# Patient Record
Sex: Female | Born: 1965 | Race: White | Hispanic: No | Marital: Married | Smoking: Former smoker
Health system: Southern US, Community
[De-identification: ages and names within clinical notes are randomized; demographics above are authoritative.]

## PROBLEM LIST (undated history)

## (undated) ENCOUNTER — Ambulatory Visit: Payer: Self-pay

## (undated) DIAGNOSIS — F329 Major depressive disorder, single episode, unspecified: Secondary | ICD-10-CM

## (undated) DIAGNOSIS — E785 Hyperlipidemia, unspecified: Secondary | ICD-10-CM

## (undated) DIAGNOSIS — F32A Depression, unspecified: Secondary | ICD-10-CM

## (undated) DIAGNOSIS — E781 Pure hyperglyceridemia: Secondary | ICD-10-CM

## (undated) DIAGNOSIS — I10 Essential (primary) hypertension: Secondary | ICD-10-CM

## (undated) DIAGNOSIS — E669 Obesity, unspecified: Secondary | ICD-10-CM

## (undated) HISTORY — DX: Pure hyperglyceridemia: E78.1

## (undated) HISTORY — PX: ABDOMINAL HYSTERECTOMY: SHX81

## (undated) HISTORY — PX: TUBAL LIGATION: SHX77

## (undated) HISTORY — DX: Hyperlipidemia, unspecified: E78.5

---

## 1998-04-26 ENCOUNTER — Other Ambulatory Visit: Admission: RE | Admit: 1998-04-26 | Discharge: 1998-04-26 | Payer: Self-pay | Admitting: Obstetrics and Gynecology

## 1998-08-06 ENCOUNTER — Ambulatory Visit (HOSPITAL_COMMUNITY): Admission: RE | Admit: 1998-08-06 | Discharge: 1998-08-06 | Payer: Self-pay | Admitting: Obstetrics and Gynecology

## 1999-08-07 ENCOUNTER — Other Ambulatory Visit: Admission: RE | Admit: 1999-08-07 | Discharge: 1999-08-07 | Payer: Self-pay | Admitting: Obstetrics and Gynecology

## 2000-07-20 ENCOUNTER — Other Ambulatory Visit: Admission: RE | Admit: 2000-07-20 | Discharge: 2000-07-20 | Payer: Self-pay | Admitting: Obstetrics and Gynecology

## 2000-08-19 ENCOUNTER — Encounter: Payer: Self-pay | Admitting: Urology

## 2000-08-19 ENCOUNTER — Encounter: Admission: RE | Admit: 2000-08-19 | Discharge: 2000-08-19 | Payer: Self-pay | Admitting: Urology

## 2000-08-24 ENCOUNTER — Encounter: Admission: RE | Admit: 2000-08-24 | Discharge: 2000-08-24 | Payer: Self-pay | Admitting: Urology

## 2000-08-24 ENCOUNTER — Encounter: Payer: Self-pay | Admitting: Urology

## 2004-12-29 ENCOUNTER — Other Ambulatory Visit: Admission: RE | Admit: 2004-12-29 | Discharge: 2004-12-29 | Payer: Self-pay | Admitting: *Deleted

## 2009-03-30 ENCOUNTER — Emergency Department (HOSPITAL_COMMUNITY): Admission: EM | Admit: 2009-03-30 | Discharge: 2009-03-30 | Payer: Self-pay | Admitting: Emergency Medicine

## 2011-03-18 LAB — CBC
HCT: 41.3 % (ref 36.0–46.0)
Hemoglobin: 14 g/dL (ref 12.0–15.0)
MCHC: 34 g/dL (ref 30.0–36.0)
MCV: 91.3 fL (ref 78.0–100.0)
Platelets: 345 10*3/uL (ref 150–400)
RDW: 12.6 % (ref 11.5–15.5)

## 2011-03-18 LAB — DIFFERENTIAL
Basophils Absolute: 0 10*3/uL (ref 0.0–0.1)
Eosinophils Absolute: 0 10*3/uL (ref 0.0–0.7)
Eosinophils Relative: 0 % (ref 0–5)
Monocytes Absolute: 0.8 10*3/uL (ref 0.1–1.0)

## 2011-03-18 LAB — URINALYSIS, ROUTINE W REFLEX MICROSCOPIC
Glucose, UA: NEGATIVE mg/dL
Ketones, ur: >80 mg/dL — AB
Leukocytes, UA: NEGATIVE
Nitrite: NEGATIVE
Protein, ur: >300 mg/dL — AB
Specific Gravity, Urine: 1.029 (ref 1.005–1.030)
Urobilinogen, UA: 1 mg/dL (ref 0.0–1.0)
pH: 6 (ref 5.0–8.0)

## 2011-03-18 LAB — BASIC METABOLIC PANEL
BUN: 7 mg/dL (ref 6–23)
CO2: 23 mEq/L (ref 19–32)
Calcium: 8.7 mg/dL (ref 8.4–10.5)
Chloride: 104 mEq/L (ref 96–112)
Creatinine, Ser: 0.58 mg/dL (ref 0.4–1.2)
GFR calc Af Amer: >60 mL/min (ref >60)
GFR calc non Af Amer: >60 mL/min (ref >60)
Glucose, Bld: 147 mg/dL — ABNORMAL HIGH (ref 70–99)
Potassium: 3.6 mEq/L (ref 3.5–5.1)
Sodium: 135 mEq/L (ref 135–145)

## 2011-03-18 LAB — URINE MICROSCOPIC-ADD ON

## 2011-06-17 ENCOUNTER — Ambulatory Visit
Admission: RE | Admit: 2011-06-17 | Discharge: 2011-06-17 | Disposition: A | Discharge status: Home / Self Care | Payer: BC Managed Care – PPO | Source: Ambulatory Visit | Attending: Physician Assistant | Admitting: Physician Assistant

## 2011-06-17 ENCOUNTER — Other Ambulatory Visit: Payer: Self-pay | Admitting: Physician Assistant

## 2011-06-17 DIAGNOSIS — M79609 Pain in unspecified limb: Secondary | ICD-10-CM

## 2014-03-06 ENCOUNTER — Ambulatory Visit
Admission: RE | Admit: 2014-03-06 | Discharge: 2014-03-06 | Disposition: A | Discharge status: Home / Self Care | Payer: BC Managed Care – PPO | Source: Ambulatory Visit | Attending: Nurse Practitioner | Admitting: Nurse Practitioner

## 2014-03-06 ENCOUNTER — Other Ambulatory Visit: Payer: Self-pay | Admitting: Nurse Practitioner

## 2014-03-06 DIAGNOSIS — R059 Cough, unspecified: Secondary | ICD-10-CM

## 2014-03-06 DIAGNOSIS — R05 Cough: Secondary | ICD-10-CM

## 2015-06-16 IMAGING — CR DG CHEST 2V
3 series · 3 of 3 positions shown · non-contrast
Comparison: Chest x-ray of 03/30/2009

CLINICAL DATA: Cough for 2 months

EXAM:
CHEST  2 VIEW

[view not recorded (1 of 3)]
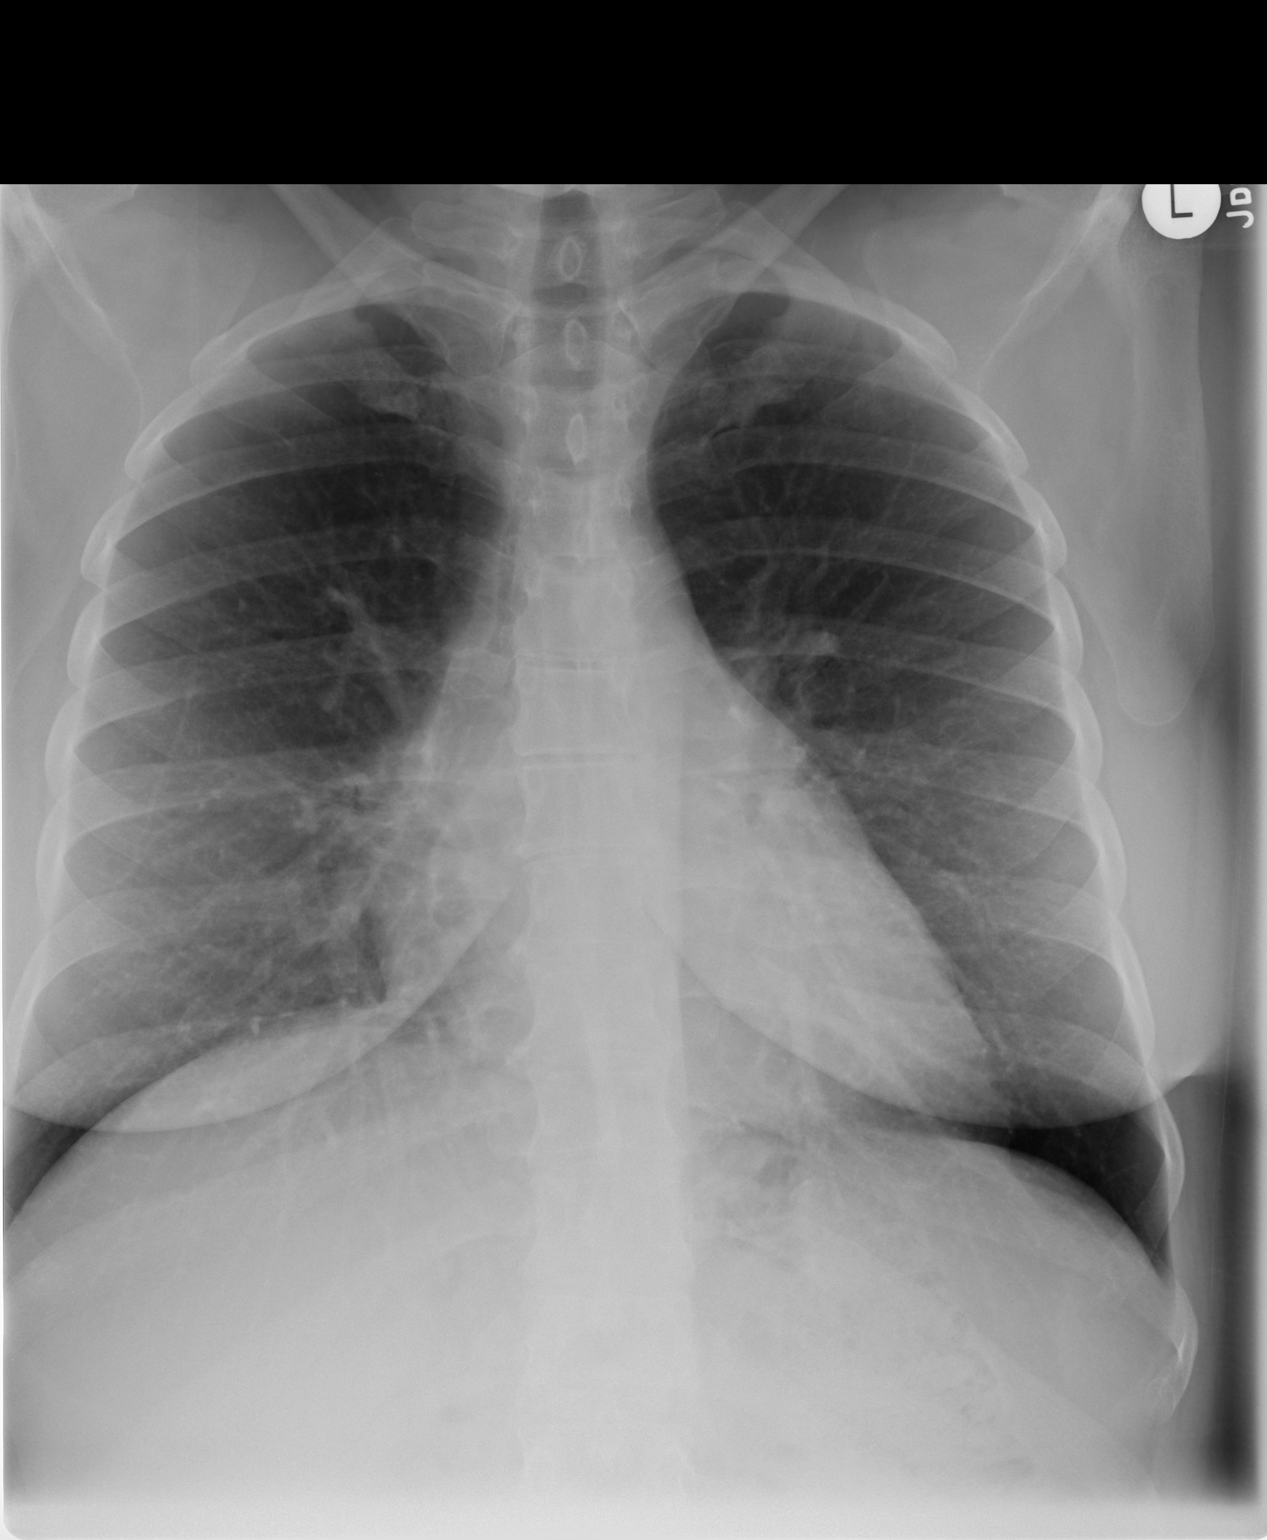

[view not recorded (2 of 3)]
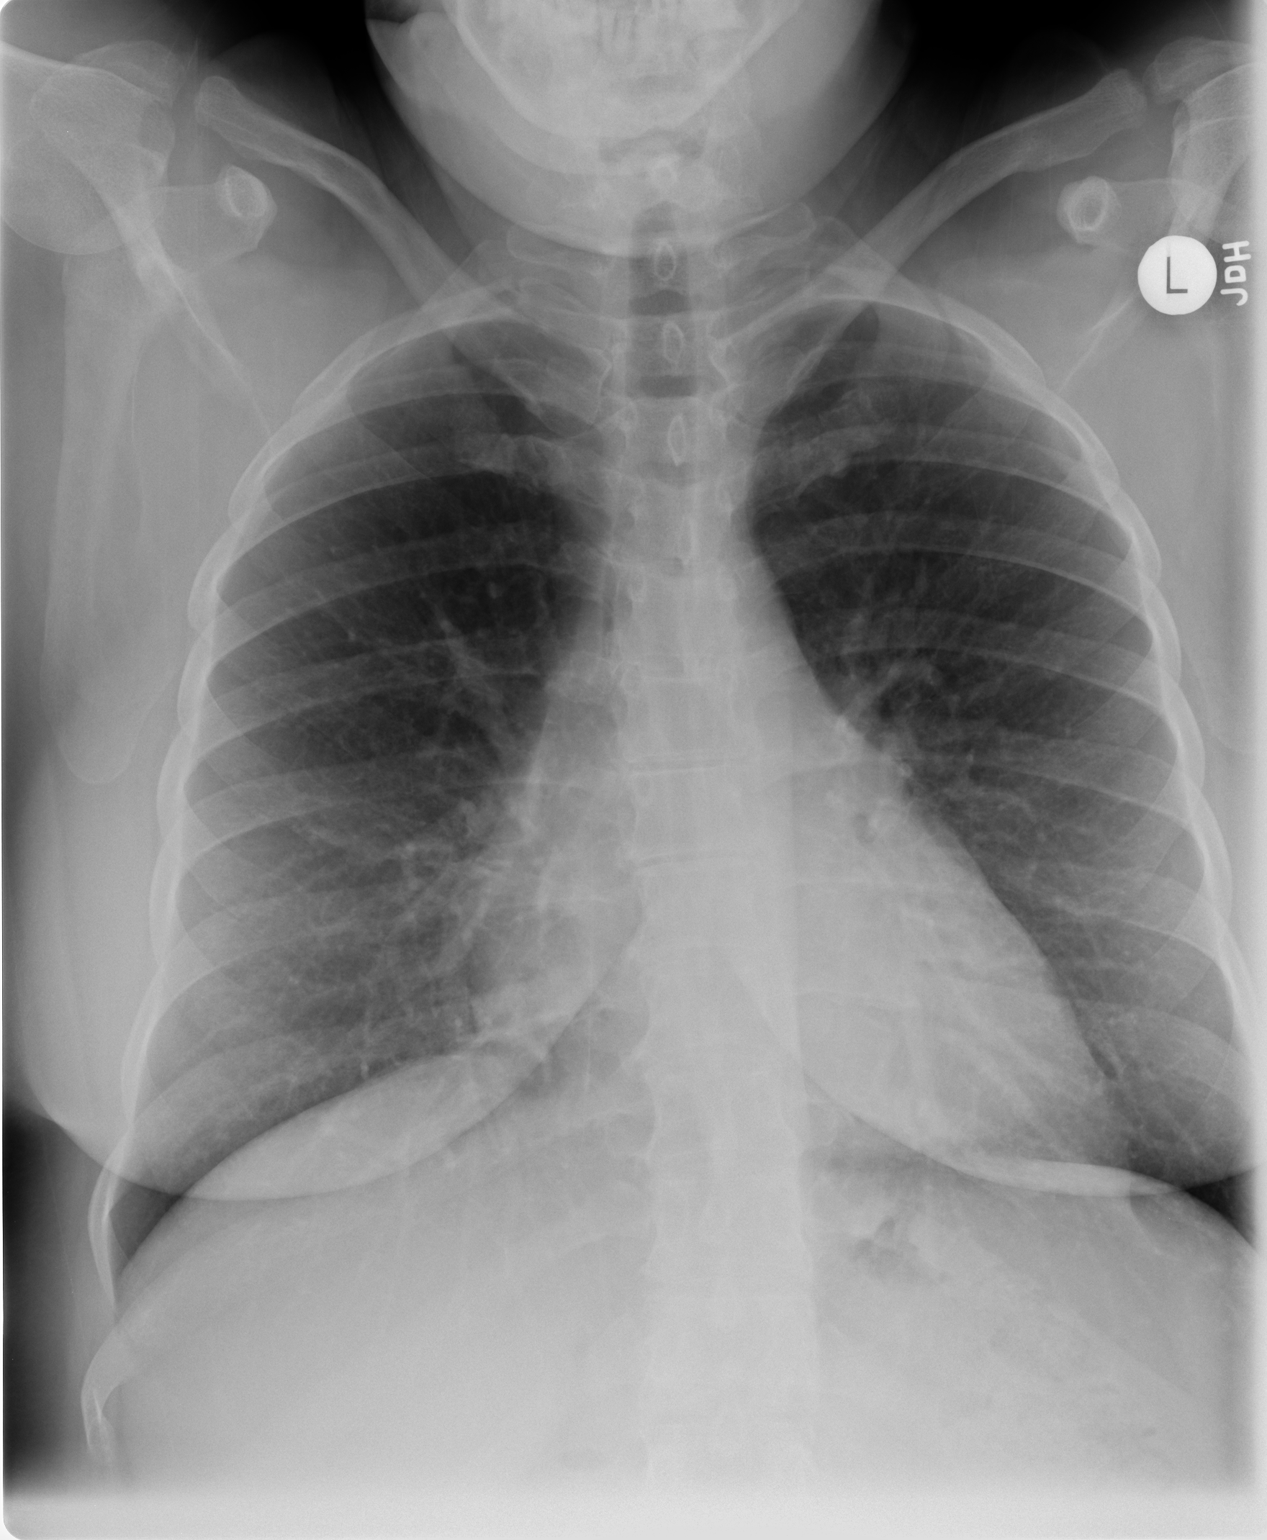

[view not recorded (3 of 3)]
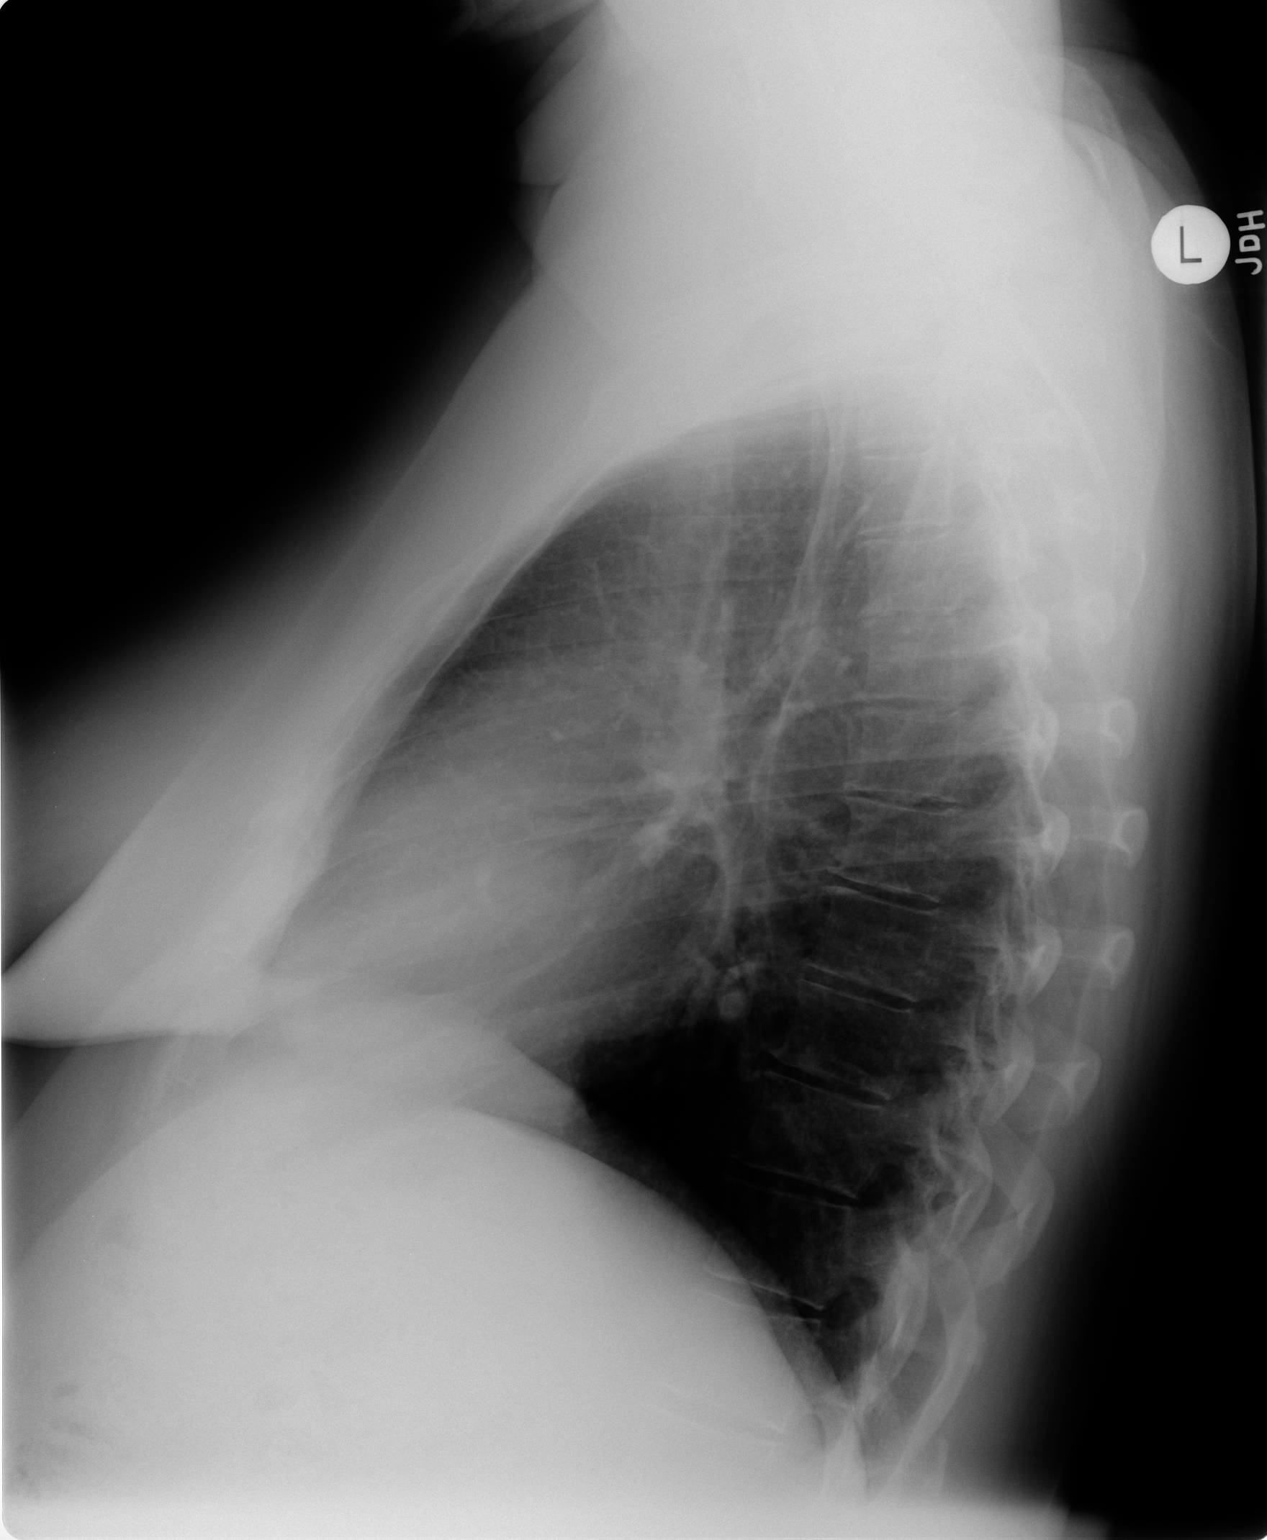

[3 of 3 positions shown; findings below may reference images not displayed]

FINDINGS: The left lower lobe pneumonia has cleared. No infiltrate or effusion
is seen. Mediastinal contours are stable and the heart is within
normal limits in size. No bony abnormality is seen.
IMPRESSION: No active cardiopulmonary disease.

## 2015-08-14 ENCOUNTER — Other Ambulatory Visit: Payer: Self-pay | Admitting: Nurse Practitioner

## 2015-08-14 DIAGNOSIS — Z1231 Encounter for screening mammogram for malignant neoplasm of breast: Secondary | ICD-10-CM

## 2015-08-19 ENCOUNTER — Ambulatory Visit: Payer: Self-pay

## 2015-08-21 ENCOUNTER — Ambulatory Visit
Admission: RE | Admit: 2015-08-21 | Discharge: 2015-08-21 | Disposition: A | Discharge status: Home / Self Care | Payer: BLUE CROSS/BLUE SHIELD | Source: Ambulatory Visit | Attending: Nurse Practitioner | Admitting: Nurse Practitioner

## 2015-08-21 DIAGNOSIS — Z1231 Encounter for screening mammogram for malignant neoplasm of breast: Secondary | ICD-10-CM

## 2015-09-19 ENCOUNTER — Encounter: Payer: Self-pay | Admitting: Nurse Practitioner

## 2017-06-24 ENCOUNTER — Encounter: Payer: Self-pay | Admitting: *Deleted

## 2017-11-28 ENCOUNTER — Emergency Department (HOSPITAL_COMMUNITY)
Admission: EM | Admit: 2017-11-28 | Discharge: 2017-11-28 | Disposition: A | Discharge status: Home / Self Care | Payer: 59 | Attending: Emergency Medicine | Admitting: Emergency Medicine

## 2017-11-28 ENCOUNTER — Emergency Department (HOSPITAL_COMMUNITY): Payer: 59

## 2017-11-28 ENCOUNTER — Other Ambulatory Visit: Payer: Self-pay

## 2017-11-28 ENCOUNTER — Encounter (HOSPITAL_COMMUNITY): Payer: Self-pay | Admitting: *Deleted

## 2017-11-28 DIAGNOSIS — I10 Essential (primary) hypertension: Secondary | ICD-10-CM | POA: Insufficient documentation

## 2017-11-28 DIAGNOSIS — M7918 Myalgia, other site: Secondary | ICD-10-CM | POA: Diagnosis not present

## 2017-11-28 DIAGNOSIS — R51 Headache: Secondary | ICD-10-CM | POA: Diagnosis not present

## 2017-11-28 DIAGNOSIS — J181 Lobar pneumonia, unspecified organism: Secondary | ICD-10-CM | POA: Diagnosis not present

## 2017-11-28 DIAGNOSIS — R112 Nausea with vomiting, unspecified: Secondary | ICD-10-CM | POA: Diagnosis present

## 2017-11-28 DIAGNOSIS — J189 Pneumonia, unspecified organism: Secondary | ICD-10-CM

## 2017-11-28 HISTORY — DX: Obesity, unspecified: E66.9

## 2017-11-28 HISTORY — DX: Depression, unspecified: F32.A

## 2017-11-28 HISTORY — DX: Major depressive disorder, single episode, unspecified: F32.9

## 2017-11-28 HISTORY — DX: Essential (primary) hypertension: I10

## 2017-11-28 LAB — URINALYSIS, ROUTINE W REFLEX MICROSCOPIC
Glucose, UA: 100 mg/dL — AB
Ketones, ur: >80 mg/dL — AB
Leukocytes, UA: NEGATIVE
NITRITE: POSITIVE — AB
Protein, ur: >300 mg/dL — AB
SPECIFIC GRAVITY, URINE: 1.025 (ref 1.005–1.030)
pH: 6.5 (ref 5.0–8.0)

## 2017-11-28 LAB — CBC WITH DIFFERENTIAL/PLATELET
Basophils Absolute: 0 10*3/uL (ref 0.0–0.1)
Basophils Relative: 0 %
EOS ABS: 0 10*3/uL (ref 0.0–0.7)
Eosinophils Relative: 0 %
HCT: 45.1 % (ref 36.0–46.0)
HEMOGLOBIN: 14.9 g/dL (ref 12.0–15.0)
LYMPHS ABS: 0.9 10*3/uL (ref 0.7–4.0)
Lymphocytes Relative: 5 %
MCH: 30.6 pg (ref 26.0–34.0)
MCHC: 33 g/dL (ref 30.0–36.0)
MCV: 92.6 fL (ref 78.0–100.0)
Monocytes Absolute: 1.4 10*3/uL — ABNORMAL HIGH (ref 0.1–1.0)
Monocytes Relative: 8 %
NEUTROS ABS: 15 10*3/uL — AB (ref 1.7–7.7)
NEUTROS PCT: 87 %
Platelets: 284 10*3/uL (ref 150–400)
RBC: 4.87 MIL/uL (ref 3.87–5.11)
RDW: 12.9 % (ref 11.5–15.5)
WBC: 17.3 10*3/uL — AB (ref 4.0–10.5)

## 2017-11-28 LAB — COMPREHENSIVE METABOLIC PANEL
ALT: 30 U/L (ref 14–54)
ANION GAP: 10 (ref 5–15)
AST: 24 U/L (ref 15–41)
Albumin: 3.2 g/dL — ABNORMAL LOW (ref 3.5–5.0)
Alkaline Phosphatase: 78 U/L (ref 38–126)
BUN: 10 mg/dL (ref 6–20)
CHLORIDE: 100 mmol/L — AB (ref 101–111)
CO2: 23 mmol/L (ref 22–32)
CREATININE: 0.92 mg/dL (ref 0.44–1.00)
Calcium: 8.5 mg/dL — ABNORMAL LOW (ref 8.9–10.3)
Glucose, Bld: 131 mg/dL — ABNORMAL HIGH (ref 65–99)
POTASSIUM: 3.4 mmol/L — AB (ref 3.5–5.1)
Sodium: 133 mmol/L — ABNORMAL LOW (ref 135–145)
Total Bilirubin: 1.6 mg/dL — ABNORMAL HIGH (ref 0.3–1.2)
Total Protein: 7.7 g/dL (ref 6.5–8.1)

## 2017-11-28 LAB — URINALYSIS, MICROSCOPIC (REFLEX)

## 2017-11-28 LAB — I-STAT CG4 LACTIC ACID, ED
LACTIC ACID, VENOUS: 1.03 mmol/L (ref 0.5–1.9)
Lactic Acid, Venous: 1.09 mmol/L (ref 0.5–1.9)

## 2017-11-28 LAB — I-STAT BETA HCG BLOOD, ED (MC, WL, AP ONLY)

## 2017-11-28 LAB — INFLUENZA PANEL BY PCR (TYPE A & B)
INFLBPCR: NEGATIVE
Influenza A By PCR: NEGATIVE

## 2017-11-28 MED ORDER — ONDANSETRON HCL 4 MG PO TABS: 4.0000 mg | ORAL_TABLET | Freq: Four times a day (QID) | ORAL | 0 refills | Status: DC

## 2017-11-28 MED ORDER — DEXTROSE 5 % IV SOLN: 1.0000 g | INTRAVENOUS | Status: DC

## 2017-11-28 MED ORDER — DEXTROSE 5 % IV SOLN
500.0000 mg | Freq: Once | INTRAVENOUS | Status: AC
  Administered 2017-11-28: 500 mg via INTRAVENOUS
  Filled 2017-11-28: qty 500

## 2017-11-28 MED ORDER — DOXYCYCLINE HYCLATE 100 MG PO CAPS: 100.0000 mg | ORAL_CAPSULE | Freq: Two times a day (BID) | ORAL | 0 refills | Status: AC

## 2017-11-28 MED ORDER — KETOROLAC TROMETHAMINE 30 MG/ML IJ SOLN
30.0000 mg | Freq: Once | INTRAMUSCULAR | Status: AC
  Administered 2017-11-28: 30 mg via INTRAVENOUS
  Filled 2017-11-28: qty 1

## 2017-11-28 MED ORDER — CEFTRIAXONE SODIUM 1 G IJ SOLR
1.0000 g | Freq: Once | INTRAMUSCULAR | Status: AC
  Administered 2017-11-28: 1 g via INTRAVENOUS
  Filled 2017-11-28: qty 10

## 2017-11-28 MED ORDER — IBUPROFEN 400 MG PO TABS
400.0000 mg | ORAL_TABLET | Freq: Once | ORAL | Status: AC
  Administered 2017-11-28: 400 mg via ORAL
  Filled 2017-11-28: qty 1

## 2017-11-28 MED ORDER — SODIUM CHLORIDE 0.9 % IV BOLUS (SEPSIS): 1000.0000 mL | Freq: Once | INTRAVENOUS | Status: DC

## 2017-11-28 MED ORDER — ACETAMINOPHEN 325 MG PO TABS
650.0000 mg | ORAL_TABLET | Freq: Once | ORAL | Status: AC
  Administered 2017-11-28: 650 mg via ORAL
  Filled 2017-11-28: qty 2

## 2017-11-28 MED ORDER — SODIUM CHLORIDE 0.9 % IV BOLUS (SEPSIS)
1000.0000 mL | Freq: Once | INTRAVENOUS | Status: AC
  Administered 2017-11-28: 1000 mL via INTRAVENOUS

## 2017-11-28 MED ORDER — AZITHROMYCIN 250 MG PO TABS
ORAL_TABLET | ORAL | 0 refills | Status: DC
Start: 2017-11-28 — End: 2020-12-10

## 2017-11-28 MED ORDER — DEXTROSE 5 % IV SOLN: 500.0000 mg | INTRAVENOUS | Status: DC

## 2017-11-28 NOTE — Discharge Instructions (Signed)
Please read and follow all provided instructions.  Your diagnoses today include:  1. Community acquired pneumonia of right upper lobe of lung (HCC)     Tests performed today include: Blood counts and electrolytes Chest x-ray -- shows pneumonia Vital signs. See below for your results today.   Medications prescribed:   Take any prescribed medications only as directed.  Home care instructions:  Follow any educational materials contained in this packet.  Take the complete course of antibiotics that you were prescribed.   BE VERY CAREFUL not to take multiple medicines containing Tylenol (also called acetaminophen). Doing so can lead to an overdose which can damage your liver and cause liver failure and possibly death.   Follow-up instructions: Please follow-up with your primary care provider in the next 3 days for further evaluation of your symptoms and to ensure resolution of your infection.   Return instructions:  Please return to the Emergency Department if you experience worsening symptoms.  Return immediately with worsening breathing, worsening shortness of breath, or if you feel it is taking you more effort to breathe.  Please return if you have any other emergent concerns.  Additional Information:  Your vital signs today were: BP 135/79    Pulse 98    Temp (!) 101.3 F (38.5 C) (Oral)    Resp (!) 21    SpO2 94%  If your blood pressure (BP) was elevated above 135/85 this visit, please have this repeated by your doctor within one month. --------------

## 2017-11-28 NOTE — ED Notes (Signed)
Patient given discharge instructions and verbalized understanding.  Patient stable to discharge at this time.  Patient is alert and oriented to baseline.  No distressed noted at this time.  All belongings taken with the patient at discharge.   

## 2017-11-28 NOTE — ED Provider Notes (Signed)
[facility] Note   CSN: [id number] Arrival date & time: 11/28/17  1421     History   Chief Complaint Chief Complaint  Patient presents with  . Fever  . Emesis    HPI Beth Crawford is a 51 y.o. female.  HPI  51 y.o. female with a hx of HTN, Obesity, presents to the Emergency Department today due to N/V and diffuse body aches since Friday. Denies diarrhea. Notes headache as well as sore throat. Denies CP/SOB/ABD pain. Rates generalized body aches 4/10. No dysuria. No flank pain or back pain. Pt otherwise healthy and tolerating PO. No recent hospitalizations. Took Motrin 400mg  around 1100 as well as Tylenol 1000mg  around noon. No other symptoms noted    Past Medical History:  Diagnosis Date  . Depression   . Hypertension   . Obesity     There are no active problems to display for this patient.   History reviewed. No pertinent surgical history.  OB History    No data available       Home Medications    Prior to Admission medications   Not on File    Family History History reviewed. No pertinent family history.  Social History Social History   Tobacco Use  . Smoking status: Never Smoker  Substance Use Topics  . Alcohol use: No    Frequency: Never  . Drug use: No     Allergies   Patient has no known allergies.   Review of Systems Review of Systems ROS reviewed and all are negative for acute change except as noted in the HPI.  Physical Exam Updated Vital Signs BP (!) 169/90   Pulse (!) 113   Temp (!) 101.3 F (38.5 C) (Oral)   Resp (!) 23   SpO2 93%   Physical Exam  Constitutional: She is oriented to person, place, and time. She appears well-developed and well-nourished. No distress.  Well appearing. Non toxic  HENT:  Head: Normocephalic and atraumatic.  Right Ear: Tympanic membrane, external ear and ear canal normal.  Left Ear: Tympanic membrane, external ear and ear canal normal.  Nose:  Nose normal.  Mouth/Throat: Uvula is midline, oropharynx is clear and moist and mucous membranes are normal. No trismus in the jaw. No oropharyngeal exudate, posterior oropharyngeal erythema or tonsillar abscesses.  Eyes: EOM are normal. Pupils are equal, round, and reactive to light.  Neck: Normal range of motion. Neck supple. No tracheal deviation present.  Cardiovascular: Regular rhythm, S1 normal, S2 normal, normal heart sounds, intact distal pulses and normal pulses. Tachycardia present.  Pulmonary/Chest: Effort normal and breath sounds normal. No respiratory distress. She has no decreased breath sounds. She has no wheezes. She has no rhonchi. She has no rales.  Abdominal: Normal appearance and bowel sounds are normal. There is no tenderness.  Musculoskeletal: Normal range of motion.  Neurological: She is alert and oriented to person, place, and time.  Skin: Skin is warm and dry.  Psychiatric: She has a normal mood and affect. Her speech is normal and behavior is normal. Thought content normal.  Nursing note and vitals reviewed.  ED Treatments / Results  Labs (all labs ordered are listed, but only abnormal results are displayed) Labs Reviewed  COMPREHENSIVE METABOLIC PANEL - Abnormal; Notable for the following components:      Result Value   Sodium 133 (*)    Potassium 3.4 (*)    Chloride 100 (*)    Glucose, Bld 131 (*)  Calcium 8.5 (*)    Albumin 3.2 (*)    Total Bilirubin 1.6 (*)    All other components within normal limits  CBC WITH DIFFERENTIAL/PLATELET - Abnormal; Notable for the following components:   WBC 17.3 (*)    Neutro Abs 15.0 (*)    Monocytes Absolute 1.4 (*)    All other components within normal limits  URINALYSIS, ROUTINE W REFLEX MICROSCOPIC - Abnormal; Notable for the following components:   Color, Urine ORANGE (*)    APPearance HAZY (*)    Glucose, UA 100 (*)    Hgb urine dipstick LARGE (*)    Bilirubin Urine MODERATE (*)    Ketones, ur >80 (*)     Protein, ur >300 (*)    Nitrite POSITIVE (*)    All other components within normal limits  URINALYSIS, MICROSCOPIC (REFLEX) - Abnormal; Notable for the following components:   Bacteria, UA MANY (*)    Squamous Epithelial / LPF 6-30 (*)    All other components within normal limits  CULTURE, BLOOD (ROUTINE X 2)  CULTURE, BLOOD (ROUTINE X 2)  INFLUENZA PANEL BY PCR (TYPE A & B)  I-STAT CG4 LACTIC ACID, ED  I-STAT BETA HCG BLOOD, ED ([facility], [facility], [facility] ONLY)  I-STAT CG4 LACTIC ACID, ED    EKG  EKG Interpretation None       Radiology Dg Chest 2 View  Result Date: 11/28/2017 CLINICAL DATA:  Fever, weakness EXAM: CHEST  2 VIEW COMPARISON:  03/06/2014 FINDINGS: There is right upper lobe airspace disease most concerning for pneumonia. There is no pleural effusion or pneumothorax. The heart and mediastinal contours are unremarkable. The osseous structures are unremarkable. IMPRESSION: Right upper lobe pneumonia. Followup PA and lateral chest X-ray is recommended in 3-4 weeks following trial of antibiotic therapy to ensure resolution and exclude underlying malignancy. Electronically Signed   By: Elige Ko   On: 11/28/2017 15:26    Procedures Procedures (including critical care time)  Medications Ordered in ED Medications  cefTRIAXone (ROCEPHIN) 1 g in dextrose 5 % 50 mL IVPB (0 g Intravenous Stopped 11/28/17 1652)  azithromycin (ZITHROMAX) 500 mg in dextrose 5 % 250 mL IVPB (500 mg Intravenous New Bag/Given 11/28/17 1714)  sodium chloride 0.9 % bolus 1,000 mL (1,000 mLs Intravenous New Bag/Given 11/28/17 1625)  ketorolac (TORADOL) 30 MG/ML injection 30 mg (30 mg Intravenous Given 11/28/17 1627)  acetaminophen (TYLENOL) tablet 650 mg (650 mg Oral Given 11/28/17 1723)   Initial Impression / Assessment and Plan / ED Course  I have reviewed the triage vital signs and the nursing notes.  Pertinent labs & imaging results that were available during my care of the patient were reviewed by me and  considered in my medical decision making (see chart for details).  Final Clinical Impressions(s) / ED Diagnoses  {I have reviewed and evaluated the relevant laboratory values. {I have reviewed and evaluated the relevant imaging studies. {I have interpreted the relevant EKG. {I have reviewed the relevant previous healthcare records.  {I obtained HPI from historian. {Patient discussed with supervising physician.  ED Course:  Assessment: Pt is a 51 y.o. female with a hx of HTN, Obesity, presents to the Emergency Department today due to N/V and diffuse body aches since Friday. Denies diarrhea. Notes headache as well as sore throat. Denies CP/SOB/ABD pain. Rates generalized body aches 4/10. No dysuria. No flank pain or back pain. Pt otherwise healthy and tolerating PO. No recent hospitalizations. Took Motrin 400mg  around 1100 as well as Tylenol  1000mg  around noon. On exam, pt in Nontoxic/nonseptic appearing. VS with tachycardia 115. Temp 101.31F. Lungs CTA. Abdomen nontender soft. EKG unremarkable. iStat Lactate 1.03. WBC 17.3. CXR shows right upper lobe PNA. Influenza pending. Given rocephin/azithro abx to cover CAP. 2L NS bolus given. UA shows evidence of UTI. Pt without symptoms of dysuria or abdominal pain. Discussed results with patient. Discussed options of staying in the hospital vs going home and patient requested to go home with ABX. Risks and benefits discussed. Strict return precautions given. Plan is to DC home with follow up to PCP. Given Rx Azithro and Doxy.    Disposition/Plan:  DC Home Additional Verbal discharge instructions given and discussed with patient.  Pt Instructed to f/u with PCP in the next week for evaluation and treatment of symptoms. Return precautions given Pt acknowledges and agrees with plan  Supervising Physician Gerhard MunchLockwood, Robert, MD  Final diagnoses:  Community acquired pneumonia of right upper lobe of lung [facility](HCC)    ED Discharge Orders    None         Audry PiliMohr,  Wetzel Meester, PA-C 11/28/17 1859    Gerhard MunchLockwood, Robert, MD 11/28/17 2330

## 2017-11-28 NOTE — ED Triage Notes (Addendum)
Pt reports feeling bad since Friday. Has mild cough, n/v, body aches. Denies diarrhea, headache or sore throat. Pt has taken both tylenol and advil pta, still febrile at triage.

## 2017-12-03 LAB — CULTURE, BLOOD (ROUTINE X 2)
Culture: NO GROWTH
Culture: NO GROWTH
SPECIAL REQUESTS: ADEQUATE
Special Requests: ADEQUATE

## 2020-12-10 ENCOUNTER — Ambulatory Visit: Payer: Self-pay | Admitting: Cardiology

## 2020-12-10 ENCOUNTER — Other Ambulatory Visit: Payer: Self-pay

## 2020-12-10 ENCOUNTER — Encounter: Payer: Self-pay | Admitting: Cardiology

## 2020-12-10 VITALS — BP 162/107 | HR 82 | Resp 16 | Ht 65.0 in | Wt 276.0 lb

## 2020-12-10 DIAGNOSIS — F17200 Nicotine dependence, unspecified, uncomplicated: Secondary | ICD-10-CM

## 2020-12-10 DIAGNOSIS — E781 Pure hyperglyceridemia: Secondary | ICD-10-CM

## 2020-12-10 DIAGNOSIS — Z6841 Body Mass Index (BMI) 40.0 and over, adult: Secondary | ICD-10-CM

## 2020-12-10 DIAGNOSIS — E7800 Pure hypercholesterolemia, unspecified: Secondary | ICD-10-CM

## 2020-12-10 DIAGNOSIS — I10 Essential (primary) hypertension: Secondary | ICD-10-CM

## 2020-12-10 DIAGNOSIS — R072 Precordial pain: Secondary | ICD-10-CM

## 2020-12-10 NOTE — Progress Notes (Signed)
Date:  12/10/2020   ID:  [name], DOB [birth year], MRN [id number]  PCP:  Everardo Beals, NP  Cardiologist:  Rex Kras, DO, [facility] (established care 12/10/2020)  REASON FOR CONSULT: Chest pain  REQUESTING PHYSICIAN:  Everardo Beals, NP [place],  [place] [id number]  Chief Complaint  Patient presents with  . Chest Pain    HPI  Beth GROSECLOSE is a 55 y.o. female who presents to the office with a chief complaint of " chest pain." Patient's past medical history and cardiovascular risk factors include: Hyperlipidemia, hypertriglyceridemia, hypertension, bipolar disorder, vitamin D deficiency, obesity due to excess calories.  She is referred to the office at the request of Everardo Beals, NP for evaluation of chest pain.  Chest pain: Patient states that about 2 weeks ago she was having chest discomfort while at rest.  The pain was over the left anterior chest wall and reproducible at 3 different spots.  The pain was 3 out of 10 in intensity, sharp pinch-like sensation, lasted for a few minutes, self-limited.  She has not noticed the pain with effort related activities and does not resolve with rest.  Since then patient states that she continues to have discomfort intermittently but not as severe.  She had discussed the symptoms with her PCP and is referred to cardiology for further evaluation and management.  Additional risk factors include hypertension, hypertriglyceridemia, obesity, and active smoking.  No family history of premature coronary disease or sudden cardiac death.   FUNCTIONAL STATUS: No structured exercise program or daily routine.   ALLERGIES: No Known Allergies  MEDICATION LIST PRIOR TO VISIT: Current Meds  Medication Sig  . bisoprolol-hydrochlorothiazide (ZIAC) 5-6.25 MG tablet Take 1 tablet by mouth daily.  . Cholecalciferol (VITAMIN D3) 50000 units CAPS Take 50,000 Units by mouth once a week.  . citalopram (CELEXA) 20 MG tablet  Take 20 mg by mouth daily.  . fenofibrate 160 MG tablet   . ibuprofen (ADVIL,MOTRIN) 200 MG tablet Take 200 mg by mouth every 6 (six) hours as needed for moderate pain.  Marland Kitchen lamoTRIgine (LAMICTAL) 25 MG tablet Take 50 mg by mouth every morning.  Marland Kitchen losartan (COZAAR) 25 MG tablet Take 25 mg by mouth daily.     PAST MEDICAL HISTORY: Past Medical History:  Diagnosis Date  . Depression   . Hyperlipidemia   . Hypertension   . Hypertriglyceridemia   . Obesity     PAST SURGICAL HISTORY: Past Surgical History:  Procedure Laterality Date  . ABDOMINAL HYSTERECTOMY    . TUBAL LIGATION      FAMILY HISTORY: The patient family history includes Alzheimer's disease in her father.  SOCIAL HISTORY:  The patient  reports that she has been smoking cigarettes. She has been smoking about 0.50 packs per day. She has never used smokeless tobacco. She reports that she does not drink alcohol and does not use drugs.  REVIEW OF SYSTEMS: Review of Systems  Constitutional: Negative for chills and fever.  HENT: Negative for hoarse voice and nosebleeds.   Eyes: Negative for discharge, double vision and pain.  Cardiovascular: Positive for chest pain and dyspnea on exertion (chronic, not worsening). Negative for claudication, leg swelling, near-syncope, orthopnea, palpitations, paroxysmal nocturnal dyspnea and syncope.  Respiratory: Negative for hemoptysis and shortness of breath.   Musculoskeletal: Negative for muscle cramps and myalgias.  Gastrointestinal: Negative for abdominal pain, constipation, diarrhea, hematemesis, hematochezia, melena, nausea and vomiting.  Neurological: Negative for dizziness and light-headedness.    PHYSICAL  EXAM: Vitals with BMI 12/10/2020 12/10/2020 11/28/2017  Height - $Remove'5\' 5"'RRTwlSs$  -  Weight - 276 lbs -  BMI - 42.35 -  Systolic 361 443 154  Diastolic 008 676 195  Pulse 82 82 102    CONSTITUTIONAL: Well-developed and well-nourished. No acute distress.  SKIN: Skin is warm and dry.  No rash noted. No cyanosis. No pallor. No jaundice HEAD: Normocephalic and atraumatic.  EYES: No scleral icterus MOUTH/THROAT: Moist oral membranes.  NECK: No JVD present. No thyromegaly noted. No carotid bruits  LYMPHATIC: No visible cervical adenopathy.  CHEST Normal respiratory effort. No intercostal retractions  LUNGS: Clear to auscultation bilaterally. No stridor. No wheezes. No rales.  CARDIOVASCULAR: Regular rate and rhythm, positive S1-S2, no murmurs rubs or gallops appreciated. ABDOMINAL: Obese, soft, nontender, nondistended, positive bowel sounds all 4 quadrants. No apparent ascites.  EXTREMITIES: No peripheral edema. Fait DP and PT bilaterally, warm to touch.  HEMATOLOGIC: No significant bruising NEUROLOGIC: Oriented to person, place, and time. Nonfocal. Normal muscle tone.  PSYCHIATRIC: Normal mood and affect. Normal behavior. Cooperative  CARDIAC DATABASE: EKG: 12/10/2020: Normal sinus rhythm, 79 bpm, normal axis, consider old anteroseptal infarct, poor R wave progression, without underlying ischemia or injury pattern.   Echocardiogram: No results found for this or any previous visit from the past 1095 days.   Stress Testing: No results found for this or any previous visit from the past 1095 days.   Heart Catheterization: None  LABORATORY DATA: CBC Latest Ref Rng & Units 11/28/2017 03/30/2009  WBC 4.0 - 10.5 K/uL 17.3(H) 14.4(H)  Hemoglobin 12.0 - 15.0 g/dL 14.9 14.0  Hematocrit 36.0 - 46.0 % 45.1 41.3  Platelets 150 - 400 K/uL 284 345    CMP Latest Ref Rng & Units 11/28/2017 03/30/2009  Glucose 65 - 99 mg/dL 131(H) 147(H)  BUN 6 - 20 mg/dL 10 7  Creatinine 0.44 - 1.00 mg/dL 0.92 0.58  Sodium 135 - 145 mmol/L 133(L) 135  Potassium 3.5 - 5.1 mmol/L 3.4(L) 3.6  Chloride 101 - 111 mmol/L 100(L) 104  CO2 22 - 32 mmol/L 23 23  Calcium 8.9 - 10.3 mg/dL 8.5(L) 8.7  Total Protein 6.5 - 8.1 g/dL 7.7 -  Total Bilirubin 0.3 - 1.2 mg/dL 1.6(H) -  Alkaline Phos 38 - 126 U/L  78 -  AST 15 - 41 U/L 24 -  ALT 14 - 54 U/L 30 -    Lipid Panel  No results found for: CHOL, TRIG, HDL, CHOLHDL, VLDL, LDLCALC, LDLDIRECT, LABVLDL  No components found for: NTPROBNP No results for input(s): PROBNP in the last 8760 hours. No results for input(s): TSH in the last 8760 hours.  BMP No results for input(s): NA, K, CL, CO2, GLUCOSE, BUN, CREATININE, CALCIUM, GFRNONAA, GFRAA in the last 8760 hours.  HEMOGLOBIN A1C No results found for: HGBA1C, MPG   External Labs: Collected: 05/02/2020 Creatinine 1.01 mg/dL. eGFR: 63 mL/min per 1.73 m Hemoglobin 15.3 g/dL, hematocrit 46.7% platelets 414 Lipid profile: Total cholesterol 193, triglycerides 185, HDL 45, LDL 117, non-HDL 148 AST 39, ALT 46 TSH: 3.02   IMPRESSION:    ICD-10-CM   1. Precordial pain  R07.2 EKG 12-Lead    PCV ECHOCARDIOGRAM COMPLETE    PCV CARDIAC STRESS TEST    CT CARDIAC SCORING (SELF PAY ONLY)    SARS-COV-2 RNA,(COVID-19) QUAL NAAT  2. Benign hypertension  I10   3. Hypercholesterolemia  E78.00 CT CARDIAC SCORING (SELF PAY ONLY)  4. Hypertriglyceridemia  E78.1 CT CARDIAC SCORING (SELF PAY ONLY)  5. Smoking  F17.200 CT CARDIAC SCORING (SELF PAY ONLY)  6. Class 3 severe obesity due to excess calories without serious comorbidity with body mass index (BMI) of 45.0 to 49.9 in adult [facility])  E66.01    Z68.42      RECOMMENDATIONS: CHALEY CASTELLANOS is a 55 y.o. female whose past medical history and cardiac risk factors include: Hyperlipidemia, hypertriglyceridemia, hypertension, bipolar disorder, vitamin D deficiency, obesity due to excess calories.  Precordial chest pain:  Patient symptoms are atypical in nature.  EKG independently reviewed and findings noted above.  Echocardiogram will be ordered to evaluate for structural heart disease and left ventricular systolic function.  Plan treadmill stress test  Covid screen prior to GXT.  Calcium score given her multiple cardiovascular risk factors as  noted above.  Benign essential hypertension:  Office blood pressure is not well controlled.  Currently managed by primary care provider.  Medications reconciled  Patient is asked to keep a log of her blood pressures at home and to review them with either myself or her primary care provider to see if additional medication titration is warranted.  Given her age and chronic comorbidities would recommend a goal systolic blood pressure of around 120 mmHg if able to tolerate.  Low-salt diet recommended.  Cigarette smoking  Tobacco cessation counseling:  Currently smoking 0.25 packs/day    Patient was informed of the dangers of tobacco abuse including stroke, cancer, and MI, as well as benefits of tobacco cessation.  Patient is willing to quit at this time.  Approximately 7 mins were spent counseling patient cessation techniques. We discussed various methods to help quit smoking, including deciding on a date to quit, joining a support group, pharmacological agents- nicotine gum/patch/lozenges, chantix.   I will reassess her progress at the next follow-up visit  Obesity, due to excess calories: . Body mass index is 45.93 kg/m. . I reviewed with the patient the importance of diet, regular physical activity/exercise, weight loss.   . Patient is educated on increasing physical activity gradually as tolerated.  With the goal of moderate intensity exercise for 30 minutes a day 5 days a week.  FINAL MEDICATION LIST END OF ENCOUNTER: No orders of the defined types were placed in this encounter.     Current Outpatient Medications:  .  bisoprolol-hydrochlorothiazide (ZIAC) 5-6.25 MG tablet, Take 1 tablet by mouth daily., Disp: , Rfl: 1 .  Cholecalciferol (VITAMIN D3) 50000 units CAPS, Take 50,000 Units by mouth once a week., Disp: , Rfl:  .  citalopram (CELEXA) 20 MG tablet, Take 20 mg by mouth daily., Disp: , Rfl: 1 .  fenofibrate 160 MG tablet, , Disp: , Rfl:  .  ibuprofen (ADVIL,MOTRIN)  200 MG tablet, Take 200 mg by mouth every 6 (six) hours as needed for moderate pain., Disp: , Rfl:  .  lamoTRIgine (LAMICTAL) 25 MG tablet, Take 50 mg by mouth every morning., Disp: , Rfl: 1 .  losartan (COZAAR) 25 MG tablet, Take 25 mg by mouth daily., Disp: , Rfl:   Orders Placed This Encounter  Procedures  . SARS-COV-2 RNA,(COVID-19) QUAL NAAT  . CT CARDIAC SCORING (SELF PAY ONLY)  . PCV CARDIAC STRESS TEST  . EKG 12-Lead  . PCV ECHOCARDIOGRAM COMPLETE    There are no Patient Instructions on file for this visit.   --Continue cardiac medications as reconciled in final medication list. --Return in about 4 weeks (around 01/07/2021) for Reevaluation of, Chest pain, Review test results. Or sooner if needed. --Continue follow-up  with your primary care physician regarding the management of your other chronic comorbid conditions.  Patient's questions and concerns were addressed to her satisfaction. She voices understanding of the instructions provided during this encounter.   This note was created using a voice recognition software as a result there may be grammatical errors inadvertently enclosed that do not reflect the nature of this encounter. Every attempt is made to correct such errors.  Rex Kras, [place], [facility]  Pager: [phone] Office: [phone]

## 2020-12-13 ENCOUNTER — Other Ambulatory Visit (HOSPITAL_COMMUNITY)
Admission: RE | Admit: 2020-12-13 | Discharge: 2020-12-13 | Disposition: A | Discharge status: Home / Self Care | Payer: Commercial Managed Care - PPO | Source: Ambulatory Visit | Attending: Cardiology | Admitting: Cardiology

## 2020-12-13 DIAGNOSIS — Z20822 Contact with and (suspected) exposure to covid-19: Secondary | ICD-10-CM | POA: Insufficient documentation

## 2020-12-13 LAB — SARS CORONAVIRUS 2 (TAT 6-24 HRS): SARS Coronavirus 2: NEGATIVE

## 2020-12-16 ENCOUNTER — Ambulatory Visit: Payer: Commercial Managed Care - PPO

## 2020-12-16 ENCOUNTER — Other Ambulatory Visit: Payer: Self-pay

## 2020-12-16 DIAGNOSIS — R072 Precordial pain: Secondary | ICD-10-CM

## 2020-12-17 ENCOUNTER — Ambulatory Visit: Payer: Commercial Managed Care - PPO

## 2020-12-17 DIAGNOSIS — R072 Precordial pain: Secondary | ICD-10-CM

## 2020-12-25 NOTE — Progress Notes (Signed)
Called and spoke with pt regarding echocardiogram results. Pt voiced understanding.

## 2020-12-26 NOTE — Progress Notes (Signed)
Called and spoke with patient regarding her CT results. Pt voiced understanding.

## 2020-12-26 NOTE — Progress Notes (Signed)
Called patient regarding her CT results, NA, LMAM.

## 2021-01-10 ENCOUNTER — Ambulatory Visit: Payer: Commercial Managed Care - PPO | Admitting: Cardiology

## 2021-01-10 ENCOUNTER — Encounter: Payer: Self-pay | Admitting: Cardiology

## 2021-01-10 ENCOUNTER — Other Ambulatory Visit: Payer: Self-pay

## 2021-01-10 VITALS — BP 137/84 | HR 73 | Temp 98.1°F | Resp 17 | Ht 65.0 in | Wt 273.8 lb

## 2021-01-10 DIAGNOSIS — I10 Essential (primary) hypertension: Secondary | ICD-10-CM

## 2021-01-10 DIAGNOSIS — Z712 Person consulting for explanation of examination or test findings: Secondary | ICD-10-CM

## 2021-01-10 DIAGNOSIS — R072 Precordial pain: Secondary | ICD-10-CM

## 2021-01-10 DIAGNOSIS — Z87891 Personal history of nicotine dependence: Secondary | ICD-10-CM

## 2021-01-10 DIAGNOSIS — E781 Pure hyperglyceridemia: Secondary | ICD-10-CM

## 2021-01-10 DIAGNOSIS — E7800 Pure hypercholesterolemia, unspecified: Secondary | ICD-10-CM

## 2021-01-10 NOTE — Progress Notes (Signed)
Date:  01/10/2021   ID:  [name], DOB [birth year], MRN [id number]  PCP:  Everardo Beals, NP  Cardiologist:  Rex Kras, DO, [facility] (established care 12/10/2020)  Date: 01/10/21 Last Office Visit: 12/10/2020  Chief Complaint  Patient presents with  . Results  . Chest Pain    Re-evaluation    HPI  Beth Crawford is a 55 y.o. female who presents to the office with a chief complaint of " reevaluation of chest pain and review test results." Patient's past medical history and cardiovascular risk factors include: Hyperlipidemia, hypertriglyceridemia, hypertension, bipolar disorder, vitamin D deficiency, obesity due to excess calories.  She is referred to the office at the request of Everardo Beals, NP for evaluation of chest pain.  Patient was initially referred to the office for evaluation of chest pain.  Her symptoms were predominantly noncardiac in origin; however, given her multiple cardiovascular risk factors as noted above ischemic evaluation was warranted.  Patient had an echocardiogram which notes preserved LVEF, normal diastolic function, no significant valvular heart disease.  She had a exercise stress test during which she produced 7 METS of workload and 91% of maximum predicted heart rate.  However, the stress ECG was interpreted as equivocal for ischemia.  Clinically patient denies any chest pain since last office visit.  He states that his dyspnea on exertion is also improving.  She has increased her physical activity on a daily basis.  And she has stopped smoking as of January 4th 2022.  She is congratulated on her efforts of smoking cessation.  In addition, she followed up with her PCP and her losartan has been increased to a higher dose therefore blood pressure is also improved.  ALLERGIES: No Known Allergies  MEDICATION LIST PRIOR TO VISIT: Current Meds  Medication Sig  . bisoprolol-hydrochlorothiazide (ZIAC) 10-6.25 MG tablet Take 1 tablet by mouth daily.  .  Cholecalciferol (VITAMIN D3) 50000 units CAPS Take 50,000 Units by mouth once a week.  . citalopram (CELEXA) 20 MG tablet Take 20 mg by mouth daily.  . fenofibrate 160 MG tablet   . ibuprofen (ADVIL,MOTRIN) 200 MG tablet Take 200 mg by mouth every 6 (six) hours as needed for moderate pain.  Marland Kitchen lamoTRIgine (LAMICTAL) 25 MG tablet Take 50 mg by mouth every morning.  Marland Kitchen losartan (COZAAR) 50 MG tablet Take 50 mg by mouth daily.     PAST MEDICAL HISTORY: Past Medical History:  Diagnosis Date  . Depression   . Hyperlipidemia   . Hypertension   . Hypertriglyceridemia   . Obesity     PAST SURGICAL HISTORY: Past Surgical History:  Procedure Laterality Date  . ABDOMINAL HYSTERECTOMY    . TUBAL LIGATION      FAMILY HISTORY: The patient family history includes Alzheimer's disease in her father.  SOCIAL HISTORY:  The patient  reports that she quit smoking about 4 weeks ago. Her smoking use included cigarettes. She has a 10.00 pack-year smoking history. She has never used smokeless tobacco. She reports that she does not drink alcohol and does not use drugs.  REVIEW OF SYSTEMS: Review of Systems  Constitutional: Negative for chills and fever.  HENT: Negative for hoarse voice and nosebleeds.   Eyes: Negative for discharge, double vision and pain.  Cardiovascular: Positive for dyspnea on exertion (improving). Negative for chest pain, claudication, leg swelling, near-syncope, orthopnea, palpitations, paroxysmal nocturnal dyspnea and syncope.  Respiratory: Negative for hemoptysis and shortness of breath.   Musculoskeletal: Negative for muscle cramps and myalgias.  Gastrointestinal: Negative for abdominal pain, constipation, diarrhea, hematemesis, hematochezia, melena, nausea and vomiting.  Neurological: Negative for dizziness and light-headedness.    PHYSICAL EXAM: Vitals with BMI 01/10/2021 12/10/2020 12/10/2020  Height '5\' 5"'  - '5\' 5"'   Weight 273 lbs 13 oz - 276 lbs  BMI 61.60 - 73.71  Systolic  062 694 854  Diastolic 84 627 035  Pulse 73 82 82    CONSTITUTIONAL: Well-developed and well-nourished. No acute distress.  SKIN: Skin is warm and dry. No rash noted. No cyanosis. No pallor. No jaundice HEAD: Normocephalic and atraumatic.  EYES: No scleral icterus MOUTH/THROAT: Moist oral membranes.  NECK: No JVD present. No thyromegaly noted. No carotid bruits  LYMPHATIC: No visible cervical adenopathy.  CHEST Normal respiratory effort. No intercostal retractions  LUNGS: Clear to auscultation bilaterally. No stridor. No wheezes. No rales.  CARDIOVASCULAR: Regular rate and rhythm, positive S1-S2, no murmurs rubs or gallops appreciated. ABDOMINAL: Obese, soft, nontender, nondistended, positive bowel sounds all 4 quadrants. No apparent ascites.  EXTREMITIES: No peripheral edema. Fait DP and PT bilaterally, warm to touch.  HEMATOLOGIC: No significant bruising NEUROLOGIC: Oriented to person, place, and time. Nonfocal. Normal muscle tone.  PSYCHIATRIC: Normal mood and affect. Normal behavior. Cooperative  CARDIAC DATABASE: EKG: 12/10/2020: Normal sinus rhythm, 79 bpm, normal axis, consider old anteroseptal infarct, poor R wave progression, without underlying ischemia or injury pattern.   Echocardiogram: 12/17/2020: Normal LV systolic function with visual EF 60-65%. Left ventricle cavity is normal in size. Normal global wall motion. Normal diastolic filling pattern, normal LAP. Mild tricuspid regurgitation. No evidence of pulmonary hypertension. No significant valvular abnormalities. No prior study for comparison.   Stress Testing: Exercise treadmill stress test 12/16/2020: Exercise treadmill stress test performed using Bruce protocol. Patient reached 7 METS, and 91% of age predicted maximum heart rate. Exercise capacity was low. No chest pain reported. Normal heart rate response. Hypertensive blood pressure response, peak stress BP 208/86 mmHg. Stress EKG showed sinus tachycardia, old  anteroseptal infarct, <1 mm ST elevation in leads aVR, V1,V2, V3, Diffuse <1 mm upsloping ST depression in all other leads; normalizing at 1 min into recovery. These changes are equivocal for ischemia. Recommend clinical correlation.    Coronary artery calcification scoring performed on 12/19/2018 at [facility]: Total calcium score: 0 AU  Heart Catheterization: None  LABORATORY DATA: CBC Latest Ref Rng & Units 11/28/2017 03/30/2009  WBC 4.0 - 10.5 K/uL 17.3(H) 14.4(H)  Hemoglobin 12.0 - 15.0 g/dL 14.9 14.0  Hematocrit 36.0 - 46.0 % 45.1 41.3  Platelets 150 - 400 K/uL 284 345    CMP Latest Ref Rng & Units 11/28/2017 03/30/2009  Glucose 65 - 99 mg/dL 131(H) 147(H)  BUN 6 - 20 mg/dL 10 7  Creatinine 0.44 - 1.00 mg/dL 0.92 0.58  Sodium 135 - 145 mmol/L 133(L) 135  Potassium 3.5 - 5.1 mmol/L 3.4(L) 3.6  Chloride 101 - 111 mmol/L 100(L) 104  CO2 22 - 32 mmol/L 23 23  Calcium 8.9 - 10.3 mg/dL 8.5(L) 8.7  Total Protein 6.5 - 8.1 g/dL 7.7 -  Total Bilirubin 0.3 - 1.2 mg/dL 1.6(H) -  Alkaline Phos 38 - 126 U/L 78 -  AST 15 - 41 U/L 24 -  ALT 14 - 54 U/L 30 -    Lipid Panel  No results found for: CHOL, TRIG, HDL, CHOLHDL, VLDL, LDLCALC, LDLDIRECT, LABVLDL  No components found for: NTPROBNP No results for input(s): PROBNP in the last 8760 hours. No results for input(s): TSH in the last  8760 hours.  BMP No results for input(s): NA, K, CL, CO2, GLUCOSE, BUN, CREATININE, CALCIUM, GFRNONAA, GFRAA in the last 8760 hours.  HEMOGLOBIN A1C No results found for: HGBA1C, MPG   External Labs: Collected: 05/02/2020 Creatinine 1.01 mg/dL. eGFR: 63 mL/min per 1.73 m Hemoglobin 15.3 g/dL, hematocrit 46.7% platelets 414 Lipid profile: Total cholesterol 193, triglycerides 185, HDL 45, LDL 117, non-HDL 148 AST 39, ALT 46 TSH: 3.02   IMPRESSION:    ICD-10-CM   1. Precordial pain  R07.2   2. Benign hypertension  I10   3. Hypercholesterolemia  E78.00   4. Hypertriglyceridemia  E78.1   5.  Former smoker  Z87.891   12. Class 3 severe obesity due to excess calories without serious comorbidity with body mass index (BMI) of 45.0 to 49.9 in adult (HCC)  E66.01    Z68.42   7. Encounter to discuss test results  Z71.2      RECOMMENDATIONS: CABELLA KIMM is a 55 y.o. female whose past medical history and cardiac risk factors include: Hyperlipidemia, hypertriglyceridemia, hypertension, bipolar disorder, vitamin D deficiency, obesity due to excess calories.  Precordial chest pain: Resolved  Since last office visit patient has not had any precordial chest pain.  Echocardiogram notes preserved LVEF without any significant valvular heart disease.  GXT was interpreted as overall equivocal for ischemia.  Since last office visit since her symptoms have not resurfaced, her dyspnea on exertion has improved, she has stopped smoking, and her blood pressure is better improved this shared decision was to continue with addressing her modifiable cardiovascular risk factors.  Patient understands clearly that her GXT was equivocal for ischemia.  If she continues to have symptoms of precordial pain additional testing is warranted. In addition, patient's coronary artery calcium score is 0.    Benign essential hypertension: Improving  Office blood pressures are under control.  Since last office visit she followed up with her PCP and her ARB does have been increased.  She is trying to consume a low-salt diet and increasing physical activity as tolerated.  Continue current medical management.  Former smoker: Tobacco cessation counseling was provided at the last office visit.  Since then patient has stopped smoking.  She is congratulated on her efforts.  And educated on importance of continued smoking cessation going forward.    Obesity, due to excess calories: Body mass index is 45.56 kg/m. . I reviewed with the patient the importance of diet, regular physical activity/exercise, weight loss.   . Patient is  educated on increasing physical activity gradually as tolerated.  With the goal of moderate intensity exercise for 30 minutes a day 5 days a week.  Discharge decision was to follow-up in 3 months to see if she has any recurrence of precordial pain if so, at that time as well discussed additional testing.  However, if she has symptoms before that she is asked to call the office for sooner appointment.  FINAL MEDICATION LIST END OF ENCOUNTER: No orders of the defined types were placed in this encounter.     Current Outpatient Medications:  .  bisoprolol-hydrochlorothiazide (ZIAC) 10-6.25 MG tablet, Take 1 tablet by mouth daily., Disp: , Rfl: 1 .  Cholecalciferol (VITAMIN D3) 50000 units CAPS, Take 50,000 Units by mouth once a week., Disp: , Rfl:  .  citalopram (CELEXA) 20 MG tablet, Take 20 mg by mouth daily., Disp: , Rfl: 1 .  fenofibrate 160 MG tablet, , Disp: , Rfl:  .  ibuprofen (ADVIL,MOTRIN) 200 MG tablet,  Take 200 mg by mouth every 6 (six) hours as needed for moderate pain., Disp: , Rfl:  .  lamoTRIgine (LAMICTAL) 25 MG tablet, Take 50 mg by mouth every morning., Disp: , Rfl: 1 .  losartan (COZAAR) 50 MG tablet, Take 50 mg by mouth daily., Disp: , Rfl:   No orders of the defined types were placed in this encounter.   There are no Patient Instructions on file for this visit.   --Continue cardiac medications as reconciled in final medication list. --Return in about 3 months (around 04/09/2021) for Reevaluation of, Chest pain. Or sooner if needed. --Continue follow-up with your primary care physician regarding the management of your other chronic comorbid conditions.  Patient's questions and concerns were addressed to her satisfaction. She voices understanding of the instructions provided during this encounter.   This note was created using a voice recognition software as a result there may be grammatical errors inadvertently enclosed that do not reflect the nature of this encounter.  Every attempt is made to correct such errors.  Rex Kras, [place], [facility]  Pager: [phone] Office: [phone]

## 2021-04-08 NOTE — Progress Notes (Signed)
Date:  04/09/2021   ID:  Beth Crawford, DOB 05-10-1966, MRN [id number]  PCP:  Everardo Beals, NP  Cardiologist:  Rex Kras, DO, [facility] (established care 12/10/2020)  Date: 04/09/21 Last Office Visit: 01/10/2021  Chief Complaint  Patient presents with  . Precordial pain  . Follow-up    HPI  Beth Crawford is a 55 y.o. female who presents to the office with a chief complaint of " re-evaluation of chest pain." Patient's past medical history and cardiovascular risk factors include: Hyperlipidemia, hypertriglyceridemia, hypertension, bipolar disorder, vitamin D deficiency, obesity due to excess calories.  She is referred to the office at the request of Everardo Beals, NP for evaluation of chest pain.  Patient symptoms of precordial pain are predominantly noncardiac in etiology.  However given her multiple cardiovascular risk factors and young age that shared decision was to proceed with an ischemic evaluation.  Echocardiogram notes preserved LVEF, normal diastolic function, no significant valvular heart disease.  She had a exercise stress test during which she produced 7 METS of workload and 91% of maximum predicted heart rate.  However, the stress ECG was interpreted as equivocal for ischemia.  She now presents for 57-monthfollow-up.  Over the last 3 months patient states that she is doing well from a cardiovascular standpoint.  She does not have shortness of breath or chest pain at rest or with effort related activities.  She is working on improving her modifiable cardiovascular risk factors such as low-salt diet and increasing her physical activity to 30 minutes of moderate intensity activity 5 days a week.  ALLERGIES: No Known Allergies  MEDICATION LIST PRIOR TO VISIT: Current Meds  Medication Sig  . ALPRAZolam (XANAX) 1 MG tablet Take 1 mg by mouth at bedtime as needed for anxiety.  . bisoprolol-hydrochlorothiazide (ZIAC) 10-6.25 MG tablet Take 1 tablet by mouth daily.  .  Cholecalciferol (VITAMIN D3) 50000 units CAPS Take 50,000 Units by mouth once a week.  . citalopram (CELEXA) 20 MG tablet Take 20 mg by mouth daily.  . fenofibrate 160 MG tablet   . ibuprofen (ADVIL,MOTRIN) 200 MG tablet Take 200 mg by mouth every 6 (six) hours as needed for moderate pain.  .Marland KitchenlamoTRIgine (LAMICTAL) 25 MG tablet Take 50 mg by mouth every morning.  .Marland Kitchenlosartan (COZAAR) 50 MG tablet Take 50 mg by mouth daily.  . rosuvastatin (CRESTOR) 20 MG tablet Take 20 mg by mouth daily.     PAST MEDICAL HISTORY: Past Medical History:  Diagnosis Date  . Depression   . Hyperlipidemia   . Hypertension   . Hypertriglyceridemia   . Obesity     PAST SURGICAL HISTORY: Past Surgical History:  Procedure Laterality Date  . ABDOMINAL HYSTERECTOMY    . TUBAL LIGATION      FAMILY HISTORY: The patient family history includes Alzheimer's disease in her father.  SOCIAL HISTORY:  The patient  reports that she quit smoking about 3 months ago. Her smoking use included cigarettes. She has a 10.00 pack-year smoking history. She has never used smokeless tobacco. She reports that she does not drink alcohol and does not use drugs.  REVIEW OF SYSTEMS: Review of Systems  Constitutional: Negative for chills and fever.  HENT: Negative for hoarse voice and nosebleeds.   Eyes: Negative for discharge, double vision and pain.  Cardiovascular: Negative for chest pain, claudication, dyspnea on exertion, leg swelling, near-syncope, orthopnea, palpitations, paroxysmal nocturnal dyspnea and syncope.  Respiratory: Negative for hemoptysis and shortness of breath.  Musculoskeletal: Negative for muscle cramps and myalgias.  Gastrointestinal: Negative for abdominal pain, constipation, diarrhea, hematemesis, hematochezia, melena, nausea and vomiting.  Neurological: Negative for dizziness and light-headedness.    PHYSICAL EXAM: Vitals with BMI 04/09/2021 01/10/2021 12/10/2020  Height _0  _1  -  Weight 273 lbs 6 oz  273 lbs 13 oz -  BMI 16.1 09.60 -  Systolic 454 098 119  Diastolic 80 84 147  Pulse 73 73 82    CONSTITUTIONAL: Well-developed and well-nourished. No acute distress.  SKIN: Skin is warm and dry. No rash noted. No cyanosis. No pallor. No jaundice HEAD: Normocephalic and atraumatic.  EYES: No scleral icterus MOUTH/THROAT: Moist oral membranes.  NECK: No JVD present. No thyromegaly noted. No carotid bruits  LYMPHATIC: No visible cervical adenopathy.  CHEST Normal respiratory effort. No intercostal retractions  LUNGS: Clear to auscultation bilaterally. No stridor. No wheezes. No rales.  CARDIOVASCULAR: Regular rate and rhythm, positive S1-S2, no murmurs rubs or gallops appreciated. ABDOMINAL: Obese, soft, nontender, nondistended, positive bowel sounds all 4 quadrants. No apparent ascites.  EXTREMITIES: No peripheral edema. Fait DP and PT bilaterally, warm to touch.  HEMATOLOGIC: No significant bruising NEUROLOGIC: Oriented to person, place, and time. Nonfocal. Normal muscle tone.  PSYCHIATRIC: Normal mood and affect. Normal behavior. Cooperative  CARDIAC DATABASE: EKG: 12/10/2020: Normal sinus rhythm, 79 bpm, normal axis, consider old anteroseptal infarct, poor R wave progression, without underlying ischemia or injury pattern.   Echocardiogram: 12/17/2020: Normal LV systolic function with visual EF 60-65%. Left ventricle cavity is normal in size. Normal global wall motion. Normal diastolic filling pattern, normal LAP. Mild tricuspid regurgitation. No evidence of pulmonary hypertension. No significant valvular abnormalities. No prior study for comparison.   Stress Testing: Exercise treadmill stress test 12/16/2020: Exercise treadmill stress test performed using Bruce protocol. Patient reached 7 METS, and 91% of age predicted maximum heart rate. Exercise capacity was low. No chest pain reported. Normal heart rate response. Hypertensive blood pressure response, peak stress BP 208/86  mmHg. Stress EKG showed sinus tachycardia, old anteroseptal infarct, <1 mm ST elevation in leads aVR, V1,V2, V3, Diffuse <1 mm upsloping ST depression in all other leads; normalizing at 1 min into recovery. These changes are equivocal for ischemia. Recommend clinical correlation.    Coronary artery calcification scoring performed on 12/19/2018 at [facility]: Total calcium score: 0 AU  Heart Catheterization: None  LABORATORY DATA: CBC Latest Ref Rng & Units 11/28/2017 03/30/2009  WBC 4.0 - 10.5 K/uL 17.3(H) 14.4(H)  Hemoglobin 12.0 - 15.0 g/dL 14.9 14.0  Hematocrit 36.0 - 46.0 % 45.1 41.3  Platelets 150 - 400 K/uL 284 345    CMP Latest Ref Rng & Units 11/28/2017 03/30/2009  Glucose 65 - 99 mg/dL 131(H) 147(H)  BUN 6 - 20 mg/dL 10 7  Creatinine 0.44 - 1.00 mg/dL 0.92 0.58  Sodium 135 - 145 mmol/L 133(L) 135  Potassium 3.5 - 5.1 mmol/L 3.4(L) 3.6  Chloride 101 - 111 mmol/L 100(L) 104  CO2 22 - 32 mmol/L 23 23  Calcium 8.9 - 10.3 mg/dL 8.5(L) 8.7  Total Protein 6.5 - 8.1 g/dL 7.7 -  Total Bilirubin 0.3 - 1.2 mg/dL 1.6(H) -  Alkaline Phos 38 - 126 U/L 78 -  AST 15 - 41 U/L 24 -  ALT 14 - 54 U/L 30 -    Lipid Panel  No results found for: CHOL, TRIG, HDL, CHOLHDL, VLDL, LDLCALC, LDLDIRECT, LABVLDL  No components found for: NTPROBNP No results for input(s): PROBNP in the last  8760 hours. No results for input(s): TSH in the last 8760 hours.  BMP No results for input(s): NA, K, CL, CO2, GLUCOSE, BUN, CREATININE, CALCIUM, GFRNONAA, GFRAA in the last 8760 hours.  HEMOGLOBIN A1C No results found for: HGBA1C, MPG   External Labs: Collected: 05/02/2020 Creatinine 1.01 mg/dL. eGFR: 63 mL/min per 1.73 m Hemoglobin 15.3 g/dL, hematocrit 46.7% platelets 414 Lipid profile: Total cholesterol 193, triglycerides 185, HDL 45, LDL 117, non-HDL 148 AST 39, ALT 46 TSH: 3.02   IMPRESSION:    ICD-10-CM   1. Precordial pain  R07.2   2. Benign hypertension  I10   3. Hypercholesterolemia   E78.00   4. Hypertriglyceridemia  E78.1   5. Former smoker  Z87.891   89. Class 3 severe obesity due to excess calories without serious comorbidity with body mass index (BMI) of 45.0 to 49.9 in adult [facility])  E66.01    Z68.42      RECOMMENDATIONS: Beth Crawford is a 55 y.o. female whose past medical history and cardiac risk factors include: Hyperlipidemia, hypertriglyceridemia, hypertension, bipolar disorder, vitamin D deficiency, obesity due to excess calories.  Precordial chest pain: Resolved  Patient symptoms of precordial pain appear to be noncardiac initially.  But given her multiple cardiovascular risk factors underwent an ischemic evaluation as outlined above.  Since the exercise stress ECG was equivocal for ischemia that shared decision was to possibly undergo additional cardiovascular testing.  However, at the last office visit she was doing well and asymptomatic with regards to chest pain and shortness of breath and the shared decision was to reevaluate in 3 months.   Patient presents today for 50-month follow-up and remains asymptomatic.  No chest pain or shortness of breath at rest or with effort related activities.  She is making every effort to improve her modifiable cardiovascular risk factors and would like to continue to do so without any additional testing at this time.  Clinically that is very appropriate.     Of note, patient is core total coronary calcium score is 0 and echocardiogram notes preserved LVEF.    Benign essential hypertension: Improving  Blood pressure is improving.  Medications reconciled.  Currently managed by primary care provider.  Former smoker: Tobacco cessation counseling was provided at the last office visit.  Since then patient has stopped smoking.  She is congratulated on her efforts.  And educated on importance of continued smoking cessation going forward.    Obesity, due to excess calories: Body mass index is 45.5 kg/m. . I reviewed with the  patient the importance of diet, regular physical activity/exercise, weight loss.   . Patient is educated on increasing physical activity gradually as tolerated.  With the goal of moderate intensity exercise for 30 minutes a day 5 days a week.  FINAL MEDICATION LIST END OF ENCOUNTER: No orders of the defined types were placed in this encounter.     Current Outpatient Medications:  .  ALPRAZolam (XANAX) 1 MG tablet, Take 1 mg by mouth at bedtime as needed for anxiety., Disp: , Rfl:  .  bisoprolol-hydrochlorothiazide (ZIAC) 10-6.25 MG tablet, Take 1 tablet by mouth daily., Disp: , Rfl: 1 .  Cholecalciferol (VITAMIN D3) 50000 units CAPS, Take 50,000 Units by mouth once a week., Disp: , Rfl:  .  citalopram (CELEXA) 20 MG tablet, Take 20 mg by mouth daily., Disp: , Rfl: 1 .  fenofibrate 160 MG tablet, , Disp: , Rfl:  .  ibuprofen (ADVIL,MOTRIN) 200 MG tablet, Take 200 mg by mouth  every 6 (six) hours as needed for moderate pain., Disp: , Rfl:  .  lamoTRIgine (LAMICTAL) 25 MG tablet, Take 50 mg by mouth every morning., Disp: , Rfl: 1 .  losartan (COZAAR) 50 MG tablet, Take 50 mg by mouth daily., Disp: , Rfl:  .  rosuvastatin (CRESTOR) 20 MG tablet, Take 20 mg by mouth daily., Disp: , Rfl:   No orders of the defined types were placed in this encounter.   There are no Patient Instructions on file for this visit.   --Continue cardiac medications as reconciled in final medication list. --Return in about 1 year (around 04/09/2022) for Follow up primary prevention. . Or sooner if needed. --Continue follow-up with your primary care physician regarding the management of your other chronic comorbid conditions.  Patient's questions and concerns were addressed to her satisfaction. She voices understanding of the instructions provided during this encounter.   This note was created using a voice recognition software as a result there may be grammatical errors inadvertently enclosed that do not reflect the  nature of this encounter. Every attempt is made to correct such errors.  Rex Kras, [place], [facility]  Pager: [phone] Office: [phone]

## 2021-04-09 ENCOUNTER — Encounter: Payer: Self-pay | Admitting: Cardiology

## 2021-04-09 ENCOUNTER — Ambulatory Visit: Payer: Commercial Managed Care - PPO | Admitting: Cardiology

## 2021-04-09 ENCOUNTER — Other Ambulatory Visit: Payer: Self-pay

## 2021-04-09 VITALS — BP 132/80 | HR 73 | Temp 97.3°F | Resp 16 | Ht 65.0 in | Wt 273.4 lb

## 2021-04-09 DIAGNOSIS — E781 Pure hyperglyceridemia: Secondary | ICD-10-CM

## 2021-04-09 DIAGNOSIS — I10 Essential (primary) hypertension: Secondary | ICD-10-CM

## 2021-04-09 DIAGNOSIS — E7800 Pure hypercholesterolemia, unspecified: Secondary | ICD-10-CM

## 2021-04-09 DIAGNOSIS — Z87891 Personal history of nicotine dependence: Secondary | ICD-10-CM

## 2021-04-09 DIAGNOSIS — R072 Precordial pain: Secondary | ICD-10-CM

## 2021-09-04 ENCOUNTER — Ambulatory Visit: Payer: Commercial Managed Care - PPO | Admitting: Psychiatry

## 2022-05-08 ENCOUNTER — Ambulatory Visit: Payer: Commercial Managed Care - PPO | Admitting: Cardiology

## 2022-07-01 ENCOUNTER — Other Ambulatory Visit (HOSPITAL_BASED_OUTPATIENT_CLINIC_OR_DEPARTMENT_OTHER): Payer: Self-pay

## 2022-07-01 MED ORDER — WEGOVY 0.25 MG/0.5ML ~~LOC~~ SOAJ
SUBCUTANEOUS | 1 refills | Status: DC
  Filled 2022-07-01: qty 2, 28d supply, fill #0

## 2022-07-02 ENCOUNTER — Other Ambulatory Visit (HOSPITAL_BASED_OUTPATIENT_CLINIC_OR_DEPARTMENT_OTHER): Payer: Self-pay

## 2022-07-06 ENCOUNTER — Encounter (HOSPITAL_BASED_OUTPATIENT_CLINIC_OR_DEPARTMENT_OTHER): Payer: Self-pay

## 2022-07-06 ENCOUNTER — Other Ambulatory Visit (HOSPITAL_BASED_OUTPATIENT_CLINIC_OR_DEPARTMENT_OTHER): Payer: Self-pay

## 2022-07-08 ENCOUNTER — Other Ambulatory Visit (HOSPITAL_BASED_OUTPATIENT_CLINIC_OR_DEPARTMENT_OTHER): Payer: Self-pay

## 2022-07-13 ENCOUNTER — Other Ambulatory Visit (HOSPITAL_BASED_OUTPATIENT_CLINIC_OR_DEPARTMENT_OTHER): Payer: Self-pay

## 2022-07-15 ENCOUNTER — Other Ambulatory Visit (HOSPITAL_BASED_OUTPATIENT_CLINIC_OR_DEPARTMENT_OTHER): Payer: Self-pay

## 2022-07-16 ENCOUNTER — Other Ambulatory Visit (HOSPITAL_BASED_OUTPATIENT_CLINIC_OR_DEPARTMENT_OTHER): Payer: Self-pay

## 2022-07-30 ENCOUNTER — Other Ambulatory Visit (HOSPITAL_BASED_OUTPATIENT_CLINIC_OR_DEPARTMENT_OTHER): Payer: Self-pay

## 2024-03-05 ENCOUNTER — Ambulatory Visit: Admission: EM | Admit: 2024-03-05 | Discharge: 2024-03-05 | Disposition: A | Discharge status: Home / Self Care

## 2024-03-05 ENCOUNTER — Encounter: Payer: Self-pay | Admitting: Emergency Medicine

## 2024-03-05 DIAGNOSIS — M7661 Achilles tendinitis, right leg: Secondary | ICD-10-CM

## 2024-03-05 DIAGNOSIS — M79661 Pain in right lower leg: Secondary | ICD-10-CM

## 2024-03-05 MED ORDER — PREDNISONE 20 MG PO TABS: 40.0000 mg | ORAL_TABLET | Freq: Every day | ORAL | 0 refills | Status: AC

## 2024-03-05 NOTE — ED Triage Notes (Signed)
 Pain at back of right ankle x 1 week.  No known injury.

## 2024-03-05 NOTE — ED Provider Notes (Signed)
 [facility]    CSN: [id number] Arrival date & time: 03/05/24  3244      History   Chief Complaint No chief complaint on file.   HPI Beth Crawford is a 58 y.o. female.   The history is provided by the patient.   Patient presents for complaints of pain in the back of the right ankle that has been present for the past week.  Patient denies any obvious injury or trauma.  She denies bruising, swelling, redness, or the inability to ambulate.  Patient states the pain starts in the back of her foot and radiates up the right lower extremity.  She states that the pain makes it difficult to ambulate.  She has not been taking any medication for her symptoms.  Past Medical History:  Diagnosis Date   Depression    Hyperlipidemia    Hypertension    Hypertriglyceridemia    Obesity     There are no active problems to display for this patient.   Past Surgical History:  Procedure Laterality Date   ABDOMINAL HYSTERECTOMY     TUBAL LIGATION      OB History   No obstetric history on file.      Home Medications    Prior to Admission medications   Medication Sig Start Date End Date Taking? Authorizing Provider  amLODipine (NORVASC) 2.5 MG tablet Take 2.5 mg by mouth daily.   Yes [provider]  predniSONE (DELTASONE) 20 MG tablet Take 2 tablets (40 mg total) by mouth daily with breakfast for 5 days. 03/05/24 03/10/24 Yes Leath-Warren, Sadie Haber, NP  Cholecalciferol (VITAMIN D3) 50000 units CAPS Take 50,000 Units by mouth once a week.    [provider]  citalopram (CELEXA) 20 MG tablet Take 20 mg by mouth daily. 11/13/17   [provider]  fenofibrate 160 MG tablet  11/04/20   [provider]  ibuprofen (ADVIL,MOTRIN) 200 MG tablet Take 200 mg by mouth every 6 (six) hours as needed for moderate pain.    [provider]  lamoTRIgine (LAMICTAL) 25 MG tablet Take 50 mg by mouth every morning. 11/13/17   [provider]   losartan (COZAAR) 50 MG tablet Take 50 mg by mouth daily.    [provider]    Family History Family History  Problem Relation Age of Onset   Alzheimer's disease Father     Social History Social History   Tobacco Use   Smoking status: Former    Current packs/day: 0.00    Average packs/day: 0.5 packs/day for 20.0 years (10.0 ttl pk-yrs)    Types: Cigarettes    Start date: 12/10/2000    Quit date: 12/10/2020    Years since quitting: 3.2   Smokeless tobacco: Never  Vaping Use   Vaping status: Never Used  Substance Use Topics   Alcohol use: No   Drug use: No     Allergies   Patient has no known allergies.   Review of Systems Review of Systems Per HPI  Physical Exam Triage Vital Signs ED Triage Vitals  Encounter Vitals Group     BP 03/05/24 0945 (!) 149/84     Systolic BP Percentile --      Diastolic BP Percentile --      Pulse Rate 03/05/24 0945 88     Resp 03/05/24 0945 18     Temp 03/05/24 0945 98.8 F (37.1 C)     Temp Source 03/05/24 0945 Oral  SpO2 03/05/24 0945 93 %     Weight --      Height --      Head Circumference --      Peak Flow --      Pain Score 03/05/24 0947 5     Pain Loc --      Pain Education --      Exclude from Growth Chart --    No data found.  Updated Vital Signs BP (!) 149/84 (BP Location: Right Arm)   Pulse 88   Temp 98.8 F (37.1 C) (Oral)   Resp 18   SpO2 93%   Visual Acuity Right Eye Distance:   Left Eye Distance:   Bilateral Distance:    Right Eye Near:   Left Eye Near:    Bilateral Near:     Physical Exam Vitals and nursing note reviewed.  Constitutional:      General: She is not in acute distress.    Appearance: Normal appearance.  HENT:     Head: Normocephalic.  Eyes:     Extraocular Movements: Extraocular movements intact.     Pupils: Pupils are equal, round, and reactive to light.  Pulmonary:     Effort: Pulmonary effort is normal.  Musculoskeletal:     Cervical back: Normal range of  motion.     Right ankle: No swelling, deformity or ecchymosis. Normal range of motion.     Right Achilles Tendon: Tenderness present.     Comments: Tenderness noted to the right Achilles tendon.  There is no bruising, swelling, or erythema present.  Patient reports pain with tiptoe walking.  Skin:    General: Skin is warm and dry.  Neurological:     General: No focal deficit present.     Mental Status: She is alert and oriented to person, place, and time.  Psychiatric:        Mood and Affect: Mood normal.        Behavior: Behavior normal.      UC Treatments / Results  Labs (all labs ordered are listed, but only abnormal results are displayed) Labs Reviewed - No data to display  EKG   Radiology No results found.  Procedures Procedures (including critical care time)  Medications Ordered in UC Medications - No data to display  Initial Impression / Assessment and Plan / UC Course  I have reviewed the triage vital signs and the nursing notes.  Pertinent labs & imaging results that were available during my care of the patient were reviewed by me and considered in my medical decision making (see chart for details).  Symptoms consistent with Achilles tendinitis.  Will treat with prednisone 40 mg for the next 5 days.  Ace wrap was also provided to allow for compression and support.  Supportive care recommendations were provided and discussed with the patient to include over-the-counter Tylenol, exercises and RICE therapy.  Patient was advised if symptoms fail to improve with this treatment, recommend following up with her PCP or with orthopedics for further evaluation.  Patient was in agreement with this plan of care and verbalizes understanding.  All questions were answered.  Patient stable for discharge.   Final Clinical Impressions(s) / UC Diagnoses   Final diagnoses:  Achilles tendinitis of right lower extremity  Pain in right lower leg     Discharge Instructions       Take medication as prescribed. May take over-the-counter Tylenol as needed for pain or general discomfort. Try to stay as active as  possible. Gentle range of motion exercises of the right ankle at least 2-3 times daily. RICE therapy, rest, ice, compression, and elevation.  An Ace wrap has been provided for compression.  Freeze a water bottle and rolling under the right foot is much as possible while symptoms persist. Elevate the right lower extremity is much as possible to help with pain and swelling. Wear shoes with good insole and support.  Hightop tennis shoes or sneakers may be helpful while symptoms persist. As discussed, if symptoms fail to improve with this treatment, it is recommended that you follow-up with orthopedics for further evaluation.  Have given you information for 2 offices in the area with whom you can follow-up with. Follow-up as needed.     ED Prescriptions     Medication Sig Dispense Auth. Provider   predniSONE (DELTASONE) 20 MG tablet Take 2 tablets (40 mg total) by mouth daily with breakfast for 5 days. 10 tablet Leath-Warren, Sadie Haber, NP      PDMP not reviewed this encounter.   Abran Cantor, NP 03/05/24 1015

## 2024-03-05 NOTE — Discharge Instructions (Addendum)
 Take medication as prescribed. May take over-the-counter Tylenol as needed for pain or general discomfort. Try to stay as active as possible. Gentle range of motion exercises of the right ankle at least 2-3 times daily. RICE therapy, rest, ice, compression, and elevation.  An Ace wrap has been provided for compression.  Freeze a water bottle and rolling under the right foot is much as possible while symptoms persist. Elevate the right lower extremity is much as possible to help with pain and swelling. Wear shoes with good insole and support.  Hightop tennis shoes or sneakers may be helpful while symptoms persist. As discussed, if symptoms fail to improve with this treatment, it is recommended that you follow-up with orthopedics for further evaluation.  Have given you information for 2 offices in the area with whom you can follow-up with. Follow-up as needed.

## 2024-10-17 ENCOUNTER — Ambulatory Visit

## 2024-11-27 ENCOUNTER — Ambulatory Visit

## 2024-11-27 VITALS — BP 135/99 | HR 82 | Ht 66.0 in | Wt 261.4 lb

## 2024-11-27 DIAGNOSIS — M79641 Pain in right hand: Secondary | ICD-10-CM

## 2024-11-27 DIAGNOSIS — S62366A Nondisplaced fracture of neck of fifth metacarpal bone, right hand, initial encounter for closed fracture: Secondary | ICD-10-CM

## 2024-11-27 NOTE — Progress Notes (Signed)
 "  Office Visit Note   Patient: Beth Crawford           Date of Birth: 10-15-1966           MRN: [id number] Visit Date: 11/27/2024              Requested by: Sherre Geni LABOR, NP [address] [place],  [place] [address] PCP: Sherre Geni LABOR, NP   Assessment & Plan: Visit Diagnoses:  1. Closed nondisplaced fracture of neck of fifth metacarpal bone of right hand, initial encounter   2. Pain in right hand     Plan: Natural history and expected course discussed. Questions answered. Patient has nondisplaced fracture of the 5th metacarpal neck amenable to nonoperative treatment. Patient placed in exos ulnar gutter brace and will follow up in 2 weeks for repeat radiographs. If it remains nondisplaced will have her follow up in 6 weeks for radiographs and brace removal.   Orders:  Orders Placed This Encounter  Procedures   DG Hand Complete Right     Subjective: Chief Complaint: Right hand pain  HPI Patient is a 58 y.o. year old female who presents with a hand injury involving the right hand. Onset of the symptoms was 2 days ago. Inciting event: injured while grabbing a rail with the right hand while falling. Current symptoms include pain, swelling, and inability to use hand directly after injury. Pain is located over the 5th metacarpal. Treatment to date: none.  Objective: Vital Signs: BP (!) 135/99 (Cuff Size: Large)   Pulse 82   Ht 5' 6 (1.676 m)   Wt 261 lb 6.4 oz (118.6 kg)   BMI 42.19 kg/m   Physical Exam Gen: Alert, No Acute Distress right hand: Skin intact, no erythema or induration noted. soft tissue tenderness and swelling at the 5th metacarpal neck, radial pulse normal, sensation normal, and remainder of ipsilateral wrist, hand and finger exam is normal  Imaging: Radiographs personally reviewed by me; reveal nondisplaced oblique fracture of the 5th metacarpal neck of the right hand   PMFS History: There are no active problems to display for this patient.  Past  Medical History:  Diagnosis Date   Depression    Hyperlipidemia    Hypertension    Hypertriglyceridemia    Obesity     Family History  Problem Relation Age of Onset   Alzheimer's disease Father     Past Surgical History:  Procedure Laterality Date   ABDOMINAL HYSTERECTOMY     TUBAL LIGATION     Social History   Occupational History   Not on file  Tobacco Use   Smoking status: Former    Current packs/day: 0.00    Average packs/day: 0.5 packs/day for 20.0 years (10.0 ttl pk-yrs)    Types: Cigarettes    Start date: 12/10/2000    Quit date: 12/10/2020    Years since quitting: 3.9   Smokeless tobacco: Never  Vaping Use   Vaping status: Never Used  Substance and Sexual Activity   Alcohol use: No   Drug use: No   Sexual activity: Not on file   Current Outpatient Medications  Medication Instructions   amLODipine (NORVASC) 2.5 mg, Daily   citalopram (CELEXA) 20 mg, Oral, Daily   fenofibrate 160 MG tablet No dose, route, or frequency recorded.   ibuprofen  (ADVIL ) 200 mg, Oral, Every 6 hours PRN   lamoTRIgine (LAMICTAL) 50 mg, Oral, Every morning   losartan (COZAAR) 50 mg, Oral, Daily   Vitamin D3  50,000 Units, Oral, Weekly   Patient has no known allergies.     "

## 2024-12-11 ENCOUNTER — Ambulatory Visit

## 2024-12-11 ENCOUNTER — Ambulatory Visit (INDEPENDENT_AMBULATORY_CARE_PROVIDER_SITE_OTHER)

## 2024-12-11 VITALS — BP 130/90 | HR 92 | Ht 66.0 in | Wt 261.0 lb

## 2024-12-11 DIAGNOSIS — S62366D Nondisplaced fracture of neck of fifth metacarpal bone, right hand, subsequent encounter for fracture with routine healing: Secondary | ICD-10-CM

## 2024-12-11 DIAGNOSIS — S62366A Nondisplaced fracture of neck of fifth metacarpal bone, right hand, initial encounter for closed fracture: Secondary | ICD-10-CM

## 2024-12-11 NOTE — Progress Notes (Signed)
" ° °  Office Visit Note   Patient: Beth Crawford           Date of Birth: 09-15-1966           MRN: [id number] Visit Date: 12/11/2024              Requested by: Sherre Geni LABOR, NP [address] [place],  [place] [address] PCP: Sherre Geni LABOR, NP   Assessment & Plan: Visit Diagnoses:  1. Closed nondisplaced fracture of neck of fifth metacarpal bone of right hand with routine healing, subsequent encounter     Plan: Natural history and expected course discussed. Questions answered. Patients fracture remains in acceptable alignment. Fracture brace remolded. Will continue brace and have patient follow up in 5 weeks.  Orders:  Orders Placed This Encounter  Procedures   DG Hand Complete Right     Subjective: Chief Complaint: Right hand pain  HPI Patient is a 59 y.o. year old female who presents for a follow up appointment for the right hand. Patient's symptoms are improved since last visit. Patient reports that the brace feels like it doesn't fit.  Treatment to date: Exos brace.  Objective: Vital Signs: BP (!) 130/90 (Cuff Size: Large)   Pulse 92   Ht 5' 6 (1.676 m)   Wt 261 lb (118.4 kg)   BMI 42.13 kg/m   Physical Exam Gen: Alert, No Acute Distress right hand: Brace in place, exam deferred  Radiographs: X rays of right hand reveal mild shortening at fracture site, otherwise in acceptable alignment  PMFS History: There are no active problems to display for this patient.  Past Medical History:  Diagnosis Date   Depression    Hyperlipidemia    Hypertension    Hypertriglyceridemia    Obesity     Family History  Problem Relation Age of Onset   Alzheimer's disease Father     Past Surgical History:  Procedure Laterality Date   ABDOMINAL HYSTERECTOMY     TUBAL LIGATION     Social History   Occupational History   Not on file  Tobacco Use   Smoking status: Former    Current packs/day: 0.00    Average packs/day: 0.5 packs/day for 20.0 years (10.0 ttl pk-yrs)     Types: Cigarettes    Start date: 12/10/2000    Quit date: 12/10/2020    Years since quitting: 4.0   Smokeless tobacco: Never  Vaping Use   Vaping status: Never Used  Substance and Sexual Activity   Alcohol use: No   Drug use: No   Sexual activity: Not on file   Current Outpatient Medications  Medication Instructions   amLODipine (NORVASC) 2.5 mg, Daily   citalopram (CELEXA) 20 mg, Oral, Daily   fenofibrate 160 MG tablet No dose, route, or frequency recorded.   ibuprofen  (ADVIL ) 200 mg, Oral, Every 6 hours PRN   lamoTRIgine (LAMICTAL) 50 mg, Oral, Every morning   losartan (COZAAR) 50 mg, Oral, Daily   Vitamin D3 50,000 Units, Oral, Weekly   Allergies as of 12/11/2024   (No Known Allergies)   "

## 2024-12-21 ENCOUNTER — Telehealth: Payer: Self-pay

## 2024-12-21 NOTE — Telephone Encounter (Signed)
 Lvm to r/s followup with Dr mariah

## 2025-01-07 ENCOUNTER — Telehealth: Payer: Self-pay

## 2025-01-07 NOTE — Telephone Encounter (Signed)
 MYCHART SENT ABOUT R/S APPT NO ANSWER

## 2025-01-08 ENCOUNTER — Ambulatory Visit

## 2025-01-09 ENCOUNTER — Ambulatory Visit

## 2025-01-16 ENCOUNTER — Ambulatory Visit
